# Patient Record
Sex: Female | Born: 1965 | Race: White | Hispanic: No | Smoking: Former smoker
Health system: Southern US, Community
[De-identification: ages and names within clinical notes are randomized; demographics above are authoritative.]

## PROBLEM LIST (undated history)

## (undated) DIAGNOSIS — Z789 Other specified health status: Secondary | ICD-10-CM

## (undated) DIAGNOSIS — F418 Other specified anxiety disorders: Secondary | ICD-10-CM

## (undated) DIAGNOSIS — Z7289 Other problems related to lifestyle: Secondary | ICD-10-CM

## (undated) DIAGNOSIS — I509 Heart failure, unspecified: Secondary | ICD-10-CM

## (undated) HISTORY — DX: Heart failure, unspecified: I50.9

## (undated) HISTORY — PX: CHOLECYSTECTOMY: SHX55

## (undated) HISTORY — PX: GASTRIC BYPASS: SHX52

---

## 1998-10-27 ENCOUNTER — Other Ambulatory Visit: Admission: RE | Admit: 1998-10-27 | Discharge: 1998-10-27 | Payer: Self-pay | Admitting: Obstetrics & Gynecology

## 1999-12-29 ENCOUNTER — Other Ambulatory Visit: Admission: RE | Admit: 1999-12-29 | Discharge: 1999-12-29 | Payer: Self-pay | Admitting: Obstetrics and Gynecology

## 2000-09-19 ENCOUNTER — Encounter (INDEPENDENT_AMBULATORY_CARE_PROVIDER_SITE_OTHER): Payer: Self-pay

## 2000-09-19 ENCOUNTER — Other Ambulatory Visit: Admission: RE | Admit: 2000-09-19 | Discharge: 2000-09-19 | Payer: Self-pay | Admitting: Obstetrics and Gynecology

## 2000-09-27 ENCOUNTER — Encounter (INDEPENDENT_AMBULATORY_CARE_PROVIDER_SITE_OTHER): Payer: Self-pay | Admitting: Specialist

## 2000-09-27 ENCOUNTER — Ambulatory Visit (HOSPITAL_COMMUNITY): Admission: RE | Admit: 2000-09-27 | Discharge: 2000-09-27 | Payer: Self-pay | Admitting: Obstetrics and Gynecology

## 2001-02-07 ENCOUNTER — Other Ambulatory Visit: Admission: RE | Admit: 2001-02-07 | Discharge: 2001-02-07 | Payer: Self-pay | Admitting: Obstetrics and Gynecology

## 2002-02-19 ENCOUNTER — Other Ambulatory Visit: Admission: RE | Admit: 2002-02-19 | Discharge: 2002-02-19 | Payer: Self-pay | Admitting: Obstetrics and Gynecology

## 2003-03-03 ENCOUNTER — Other Ambulatory Visit: Admission: RE | Admit: 2003-03-03 | Discharge: 2003-03-03 | Payer: Self-pay | Admitting: Obstetrics and Gynecology

## 2003-04-21 ENCOUNTER — Encounter: Admission: RE | Admit: 2003-04-21 | Discharge: 2003-07-20 | Payer: Self-pay | Admitting: *Deleted

## 2003-04-23 ENCOUNTER — Encounter: Admission: RE | Admit: 2003-04-23 | Discharge: 2003-04-23 | Payer: Self-pay | Admitting: *Deleted

## 2003-05-04 ENCOUNTER — Ambulatory Visit (HOSPITAL_COMMUNITY): Admission: RE | Admit: 2003-05-04 | Discharge: 2003-05-04 | Payer: Self-pay | Admitting: *Deleted

## 2003-05-04 ENCOUNTER — Encounter (INDEPENDENT_AMBULATORY_CARE_PROVIDER_SITE_OTHER): Payer: Self-pay | Admitting: Cardiology

## 2003-05-07 ENCOUNTER — Ambulatory Visit (HOSPITAL_BASED_OUTPATIENT_CLINIC_OR_DEPARTMENT_OTHER): Admission: RE | Admit: 2003-05-07 | Discharge: 2003-05-07 | Payer: Self-pay | Admitting: *Deleted

## 2003-07-05 ENCOUNTER — Inpatient Hospital Stay (HOSPITAL_COMMUNITY): Admission: RE | Admit: 2003-07-05 | Discharge: 2003-07-08 | Payer: Self-pay | Admitting: Surgery

## 2003-07-09 ENCOUNTER — Inpatient Hospital Stay (HOSPITAL_COMMUNITY): Admission: EM | Admit: 2003-07-09 | Discharge: 2003-07-11 | Payer: Self-pay

## 2004-04-04 ENCOUNTER — Other Ambulatory Visit: Admission: RE | Admit: 2004-04-04 | Discharge: 2004-04-04 | Payer: Self-pay | Admitting: Obstetrics and Gynecology

## 2004-06-02 ENCOUNTER — Ambulatory Visit (HOSPITAL_COMMUNITY): Admission: RE | Admit: 2004-06-02 | Discharge: 2004-06-02 | Payer: Self-pay | Admitting: Surgery

## 2004-06-19 ENCOUNTER — Observation Stay (HOSPITAL_COMMUNITY): Admission: RE | Admit: 2004-06-19 | Discharge: 2004-06-20 | Payer: Self-pay | Admitting: Surgery

## 2004-06-19 ENCOUNTER — Encounter (INDEPENDENT_AMBULATORY_CARE_PROVIDER_SITE_OTHER): Payer: Self-pay | Admitting: Specialist

## 2005-05-30 ENCOUNTER — Other Ambulatory Visit: Admission: RE | Admit: 2005-05-30 | Discharge: 2005-05-30 | Payer: Self-pay | Admitting: Obstetrics and Gynecology

## 2005-06-08 ENCOUNTER — Encounter: Admission: RE | Admit: 2005-06-08 | Discharge: 2005-06-08 | Payer: Self-pay | Admitting: Orthopaedic Surgery

## 2005-06-20 ENCOUNTER — Ambulatory Visit (HOSPITAL_COMMUNITY): Admission: RE | Admit: 2005-06-20 | Discharge: 2005-06-20 | Payer: Self-pay | Admitting: *Deleted

## 2006-03-01 ENCOUNTER — Ambulatory Visit (HOSPITAL_COMMUNITY): Admission: RE | Admit: 2006-03-01 | Discharge: 2006-03-01 | Payer: Self-pay | Admitting: Obstetrics and Gynecology

## 2007-03-15 ENCOUNTER — Emergency Department (HOSPITAL_COMMUNITY): Admission: EM | Admit: 2007-03-15 | Discharge: 2007-03-15 | Payer: Self-pay | Admitting: Emergency Medicine

## 2010-09-10 ENCOUNTER — Encounter: Payer: Self-pay | Admitting: Orthopaedic Surgery

## 2011-01-05 NOTE — Discharge Summary (Signed)
NAME:  Brianna Erickson, Brianna Erickson                        ACCOUNT NO.:  [id number]   MEDICAL RECORD NO.:  [id number]                   PATIENT TYPE:  INP   LOCATION:  0454                                 FACILITY:  [facility]   PHYSICIAN:  Sandria Bales. Ezzard Standing, M.D.               DATE OF BIRTH:  [birth year]   DATE OF ADMISSION:  07/05/2003  DATE OF DISCHARGE:  07/08/2003                                 DISCHARGE SUMMARY   DISCHARGE DIAGNOSES:  1. Morbid obesity with a BMI of approximately 47.  2. Depression.  3. Reflux esophagitis.  4. Degenerative arthritic complaints.   OPERATION PERFORMED:  The patient had a laparoscopic Roux-en-Y  gastrojejunostomy and an upper esophagogastroscopy on July 05, 2003.   HISTORY OF PRESENT ILLNESS:  [name] is a 45 year old white female patient  of C. Duane Lope, M.D. who has been morbidly obese most of her adult life.  Her current height is 5 feet 9 inches.  Her weight is 329 pounds for a BMI  of 47.  She has been through our bariatric program with preoperative  counseling including nutritional consult, psychiatric consult, and a review  of laboratory data.  She now comes for attempted laparoscopic Roux-en-Y  gastrojejunostomy.  Her comorbid problems include depression, reflux  esophagitis, degenerative arthritic complaints such as heel spurs.   She presents to the hospital after completing both a two week diet and bowel  prep.   She underwent a laparoscopic Roux-en-Y gastrojejunostomy and Sharlet Salina T.  Hoxworth, M.D. did an upper esophagogastroscopy at the same time.   Postoperatively she did well.  On her first postoperative day her white  blood count was 10,200.  Her output of her JP was minimal.  Dopplers of her  lower extremities were negative and upper GI swallow showed no evidence of  leak and she was started on a diet.  On her second postoperative day she had  some question about blood pressure recordings during the evening of the  16th, morning of the  17th.  I think this was all pretty much straightened by  morning rounds.  She had also had some headaches questionably secondary to  some of her medicines.  Her diet was slowly advanced.  By the third  postoperative day she was afebrile.  Her abdomen was soft and she was ready  for discharge.   She was given Roxicet as a pain medicine to go home with.  She was using  Ensure q.i.d.  She would change the dressing as needed and see our bariatric  coordinator nurse back on July 12, 2003.   CONDITION ON DISCHARGE:  Good.                                               Sandria Bales. Ezzard Standing, M.D.  DHN/MEDQ  D:  07/20/2003  T:  07/20/2003  Job:  [id number]   cc:   C. Duane Lope, M.D.  [address]  [place]  [place] [address]  Fax: [phone]

## 2011-01-05 NOTE — Op Note (Signed)
NAMEPANG, ROBERS              ACCOUNT NO.:  [id number]   MEDICAL RECORD NO.:  [id number]          PATIENT TYPE:  AMB   LOCATION:  DAY                          FACILITY:  [facility]   PHYSICIAN:  Sandria Bales. Ezzard Standing, M.D.  DATE OF BIRTH:  [birth year]   DATE OF PROCEDURE:  06/19/2004  DATE OF DISCHARGE:                                 OPERATIVE REPORT   PREOPERATIVE DIAGNOSIS:  Symptomatic gall stones, vague epigastic symptoms,  rule out problems with RYGB, 4 mm nodule inside right upper lip   POST OPERATIVE DIAGNOSIS:  Cholecystitis with cholelithiasis, minimal intra-  abdominal adhesions status post laparoscopic Roux-Y gastric bypass, a normal  esophagus, gastric pouch, and jejunal anastomosis, and 4 mm nodule inside  right upper lip.   PROCEDURE:  1.  Laparoscopic cholecystectomy with IOC.  2.  Laparoscopic exploration with enterolysis of at least two adhesive      bands.  3.  Esophagogastrojejunoscopy.  4.  Excision of lesion inside right upper lip.   SURGEON:  Dr. Ezzard Standing   FIRST ASSISTANT:  Dr. Carman Ching   ANESTHESIA:  General endotracheal.   ESTIMATED BLOOD LOSS:  Minimal.   INDICATIONS FOR PROCEDURE:  Brianna Erickson is a 45 year old white female, patient  of Dr. Miguel Aschoff, who is approximately 1 year status post laparoscopic Roux-  en-Y gastrojejunostomy.  She has lost approximately 100 pounds since  surgery.  The last weight that I had was 238 with a BMI of 35.  She has  developed epigastric pain which sometimes radiates through to her back, had  an ultrasound which proved she had gallstones.   I discussed with her at the same time doing a cholecystectomy probably do an  upper endoscopy to make sure her anastomosis is widely patent and there are  no other problems.  The indications and potential complications explained to  the patient.  Potential complications include but not limited to bleeding,  infection, bile duct injury, the possibility of open surgery.   OPERATIVE NOTE:  The patient presents to the operating room, underwent a  general anesthetic, given 1 g of Ancef at the initiation of the procedure.  She had PAS stockings in place.  Her abdomen was prepped with Betadine  solution and sterilely draped.  A infraumbilical incision was made with  sharp dissection carried down to the abdominal cavity.  A 10 mm 0-degree  laparoscope was inserted through a 12 mm Hasson trocar, and the Hasson  trocar was secured with a 0 Vicryl suture.  Abdominal exploration revealed  the right and left lobes of the liver were unremarkable.  There were some  adhesions on the undersurface of the left lobe of the liver where her  stomach was but really looked unremarkable.  Her bowel, which I could see,  she had a significant left omentum when I saw her a year ago, and I then  went first for the gallbladder.   Three additional trocars were placed, a 10 mm applied surgical in the  subxiphoid location, a 5 mm right subcostal, and a 5 mm lateral subcostal  incision.  The gallbladder was  grasped and rotated cephalad.  There were  noted to be quite a bit of adhesions up 3/4 of the gallbladder wall with the  duodenum stuck to this.   These were all taken down with sharp and blunt dissection.  The cystic  artery was doubly endoclipped and divided.  The cystic duct was identified,  a clip placed on the gallbladder side of the cystic duct.   An intraoperative cholangiogram was then obtained.  I used a cutoff taut  catheter inserted through a 14 gauge Jelco catheter, inserted into the side  of the cut cystic duct.   The cystic duct catheter was secured with an Endo Clip.  There was some very  minimal leakage around the clip, but it did show contrast going down the  common bile duct into the duodenum and back up towards the hepatic radicles.  There was no filling defect.  There was no mass.  Certainly, I would  consider it a suboptimal intraoperative cholangiogram;  however, again, there  was no filling defect, and the anatomy looked normal, and I thought I was  well away from the common bile duct from surgical procedure.   I then removed the taut catheter.  I put three clips on the cystic duct and  divided it.  I had already divided the cystic artery and then sharply and  bluntly dissected the gallbladder from the gallbladder bed.   The triangle of Calot was visualize.  There was no bleeding from this area.  The gallbladder was then sharply and bluntly dissected from gallbladder bed  and placed into EndoCatch bag.  After completing the gallbladder surgery, I  delivered the gallbladder through the umbilicus in the EndoCatch bag.   I then carried out an exploration of the abdominal cavity using some  Glassmans.  I was able to lift up the liver.  I was able to trace the  jejunal limb up to the stomach pouch and ran the entire length of the  jejunal limb down to the jejunojejunostomy.  There was no kink.  There were  no adhesions.  There was no evidence of any kind of hernia such as a  Peterson's hernia, but there was no evidence of any hernia internally that I  could identify and no cause for her symptoms.   The trocar was then removed, and there was no bleeding at trocar site.  The  umbilical trocar was closed with a 0 Vicryl suture.  The skin edge site  closed with a 5-0 Monocryl suture.   I then turned my attention to her mouth.  I first did an upper endoscopy by  passing Olympus endoscope down her esophagus into her gastric pouch and into  her jejunum.  The jejunal mucosal was totally normal without evidence of  ulceration or other problems.  The anastomosis was at 45 cm.  The  gastroesophageal junction was at 40 cm for a 5 cm pouch.  The pouch was not  all that big.  It really looked like it had fairly normal gastric mucosa  without ulceration, other redness, or bleeding.  The scope was then withdrawn into the distal esophagus.  There was no  mass, bleeding, or  irritation noted.   This was felt to be a normal postoperative esophagogastrojejunoscopy and  would recommend no further diagnostic tests at this time regarding her  epigastric symptoms.   Then I took out a small cyst or a little mass she had on her right upper  lip.  I prepped this with Betadine solution, made a little incision where I  excised this little nodule which was probably about 3-4 mm in diameter.  I  then placed two 3-0 chromic sutures in her upper lip in the inside mucosa  and then this was the end of the procedure.   The sponge and needle count were correct at the end of the case.  The  patient was transferred to the recovery room in good condition.  She was  going to try go home tonight, though it is already about 3:00, so I am not  sure whether she will be able to do this or not.     Pervis Hocking   DHN/MEDQ  D:  06/19/2004  T:  06/19/2004  Job:  [id number]   cc:   Miguel Aschoff, M.D.  [address]  [place]  [place] [address]  Fax: [phone]

## 2011-01-05 NOTE — Op Note (Signed)
[facility]  Patient:    Brianna Erickson, Brianna Erickson                     MRN: [id number] Proc. Date: 09/27/00 Adm. Date:  [id number] Attending:  Miguel Aschoff                           Operative Report  PREOPERATIVE DIAGNOSES:       1. Menometrorrhagia.                               2. Cervical polyp.  POSTOPERATIVE DIAGNOSES:      1. Menometrorrhagia.                               2. Cervical polyp.  PROCEDURE:                    1. Cervical dilatation.                               2. Hysteroscopy.                               3. Dilation and curettage.  SURGEON:                      Miguel Aschoff, M.D.  ANESTHESIA:                   General.  COMPLICATIONS:                None.  JUSTIFICATION:                The patient is a 45 year old white female on Loestrin oral contraceptives. The patient developed heavy vaginal bleeding following intercourse. On examination, she was found to have a large, hemorrhagic mass protruding through the cervix which was excised. 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 The patient had this done on January 30 but continued to have heavy bleeding and presents now to undergo hysteroscopy and D&C to insure that no further polyps exist within the confines of the uterus. Risks and benefits of this procedure have been discussed with the patient and informed consent has been obtained.  DESCRIPTION OF PROCEDURE:     The patient was taken to the operating room, placed in a supine position, and then general anesthesia was administered without difficulty. She was placed in the dorsal lithotomy position and prepped and draped in the usual sterile fashion. A speculum was placed in the vaginal vault and the anterior cervical lip was grasped with a tenaculum and then the endocervical canal was dilated using 0 Pratt dilators. On initial inspection, no polyps were noted coming through the cervix. At  this point, the hysteroscope was advanced under direct visualization. No endocervical polyps were noted. The scope was then advanced into endometrial cavity. No polyps were found within the cavity. The lining appeared atrophic. The tubal ostia were visible and appeared unremarkable. At this point, the hysteroscope was removed. The endocervical canal was curetted using the Kevorkian curet. This was sent as a separate specimen, then the endometrial cavity was curetted and a small amount of tissue was sent  off for histologic study. At this point, the procedure was completed. No other abnormalities were noted. There was excellent hemostasis and the patient was reversed from the anesthetic and taken to the recovery room in satisfactory condition. The estimated blood loss was about 20 cc. The patient tolerated the procedure well.  The plan is for the patient to be discharged home. Medications for home include Darvocet-N 100, one every six hours as needed for pain, Bloxin 300 mg, one twice a day for two days. She will be seen back in four weeks for followup examination and she is to call if there are any problems such as fever, pain, or heavy bleeding. DD:  09/27/00 TD:  09/28/00 Job: [id number]

## 2011-01-05 NOTE — Op Note (Signed)
NAME:  Brianna Erickson, Brianna Erickson                        ACCOUNT NO.:  [id number]   MEDICAL RECORD NO.:  [id number]                   PATIENT TYPE:  INP   LOCATION:  0001                                 FACILITY:  [facility]   PHYSICIAN:  Sandria Bales. Ezzard Standing, M.D.               DATE OF BIRTH:  [birth year]   DATE OF PROCEDURE:  07/05/2003  DATE OF DISCHARGE:                                 OPERATIVE REPORT   CCS [phone].   PREOPERATIVE DIAGNOSIS:  Morbid obesity with body mass index of 47.   POSTOPERATIVE DIAGNOSIS:  Morbidly obese with body mass index of 47.   PROCEDURES:  1. Laparoscopic Roux-en-Y gastrojejunostomy (antecolic, antegastric).  2. Esophagogastroscopy.   SURGEON:  Sandria Bales. Ezzard Standing, M.D.   ASSISTANT:  Sharlet Salina T. Hoxworth, M.D.  Dr. Johna Sheriff did the  esophagogastroscopy and will dictate that portion of the operation.   ANESTHESIA:  General endotracheal.   ESTIMATED BLOOD LOSS:  Minimal.   INDICATION FOR PROCEDURE:  Ms. Colunga is a 45 year old white female who has  been morbidly obese her entire adult life.  She has tried multiple diets  without success and now comes for an attempted laparoscopic Roux-en-Y  gastrojejunostomy.  She has been throughout preoperative teaching and  counseling, and we went through the potential complications with the  patient, including bleeding, infection, bile leak, bowel leak, deep venous  thrombosis, long-term nutritional consequences.   She presents to the operating room.  She had subcu Lovenox and IV Ancef  preoperatively.  She had PAS stockings in place, NG tube in place.  The  abdomen was prepped with Betadine solution and sterilely draped.   Using an Optiview, I accessed the abdominal cavity from the left upper  quadrant.  I insufflated the abdomen.   There were additional trocars.  There was a right subcostal 12 mm, a right  paramedian 12 mm, a left paramedian 12 mm, the original trocar was a 12 mm  that went into the left upper quadrant,  and then a left lateral 5 mm and  then a right paraumbilical 10 mm trocar.   Abdominal exploration revealed the stomach unremarkable.  The patient had a  fairly good-sized left lobe of the liver.  The right lobe was unremarkable.  The patient had actually a small amount of omentum.  Much of her fat was  down in the hips and her thighs.   I first went to the ligament of Treitz, which I identified, went down 40 cm,  and divided the jejunum with a white load of the stapler.   I then took the mesentery down to the base, freeing this up.  I then tagged  the limb going to the stomach with a Penrose drain, counted down 100 cm, and  did a jejunojejunostomy.  The jejunojejunostomy was done, first a holding  suture was placed, then a single firing of the white Ethicon GIA 45 mm  stapler.  I then closed the enterotomy with two running 2-0 Vicryl sutures.  I actually had to put one or two additional buttress sutures to close the  wound.   I then closed the mesentery with a running 2-0 silk suture with a Laparo-Tie  on both ends.   I placed Tisseel over the jejunojejunostomy and marked the jejunojejunostomy  with three clips.   I did get a hematoma right at the anastomosis with one of the stitches, but  this had stopped by the time I put the Tisseel on.  I actually rechecked at  the end of the operation and it had expanded no further.   I then turned my attention to the stomach.  After dividing the stomach, I  went down 5 cm along the lesser curve, got into the lesser sac along the  lesser curve of the stomach.  I did seven firings of the blue Endo-GIA  stapler, first transversely, then tried to walk up the stomach wall.   I had the stomach completely divided and separated.  I did place Tisseel up  along the greater curvature and the dissected greater curvature, and I used  an Ewald tube to size the stomach and make sure the esophagus was not  narrowed.   After this was complete, I then  brought up the gastric limb of the jejunum  up antecolic, antegastric, and then made an end of the stomach pouch to the  side of the jejunal anastomosis.   I sewed a posterior running 2-0 Vicryl suture.  I then made two  enterotomies, and one was with the Ewald tube pressing against the stomach,  and entered the stomach.  I then fired the stapler up trying to get about 3  cm of stapler into the stomach pouch, and then closed the enterotomy with  two running 2-0 Vicryl sutures.  Again, I had to buttress the corner along  the lesser curve with a single stitch.  Then I placed an anterior stitch of  2-0 Vicryl over the wound for a double-layer closure.   At this point Dr. Johna Sheriff went to the head.  He passed an endoscope down  into the stomach pouch.  He insufflated the stomach while I had the small  bowel clamped.  I flooded the upper abdomen and saw no evidence of air leak  or bubbling.  He then inspected the anastomosis, which was patent.  He took  a picture of this.  This is in the chart.  There was no bleeding from the  pouch site.  The anastomosis was at approximately 45 cm.  Her GE junction  was about 39-40 cm, so she had a pouch of 5-6 cm in length.   He then desufflated the stomach.  He will dictate the esophagogastroscopy.  He then deflated the stomach.  I placed Tisseel over the anterior wall of  the stomach, a total of about 5 mL of Tisseel was used during the procedure.  I then removed the Nathanson retractor and placed a Jackson-Pratt drain  under the left lobe of the liver.   The patient tolerated the procedure well.  The trocars were then removed  under direct visualization.  Sponge and needle count were correct at the end  of the case.  I stapled the skin, sewed the 19 mm Blake drain that I brought  out through the left upper quadrant 5 mm drain site.  Again, I placed this up at the esophagogastric junction and sewed this in place  with a 2-0 nylon  suture.  I closed the  skin with a skin staple with Steri-Strips on top.   The patient tolerated the procedure well, was transported to the recovery  room in good condition.  Sponge and needle count were correct at the end of  the case.                                               Sandria Bales. Ezzard Standing, M.D.    DHN/MEDQ  D:  07/05/2003  T:  07/05/2003  Job:  [id number]   cc:   Dr. Tenny Craw

## 2011-06-04 LAB — BASIC METABOLIC PANEL
BUN: 16
CO2: 28
Calcium: 8.7
Chloride: 100
Creatinine, Ser: 0.71
GFR calc Af Amer: >60
GFR calc non Af Amer: >60
Glucose, Bld: 115 — ABNORMAL HIGH
Potassium: 3.5
Sodium: 136

## 2011-06-04 LAB — DIFFERENTIAL
Basophils Absolute: 0
Basophils Relative: 0
Eosinophils Absolute: 0
Eosinophils Relative: 0
Lymphocytes Relative: 12
Lymphs Abs: 1.3
Monocytes Absolute: 0.8 — ABNORMAL HIGH
Monocytes Relative: 8
Neutro Abs: 8.8 — ABNORMAL HIGH
Neutrophils Relative %: 80 — ABNORMAL HIGH

## 2011-06-04 LAB — CBC
HCT: 32.4 — ABNORMAL LOW
Hemoglobin: 11.2 — ABNORMAL LOW
MCHC: 34.6
MCV: 92.8
Platelets: 200
RBC: 3.49 — ABNORMAL LOW
RDW: 14.4 — ABNORMAL HIGH
WBC: 11 — ABNORMAL HIGH

## 2011-08-24 ENCOUNTER — Other Ambulatory Visit: Payer: Self-pay | Admitting: Obstetrics and Gynecology

## 2013-02-05 ENCOUNTER — Other Ambulatory Visit: Payer: Self-pay | Admitting: Orthopaedic Surgery

## 2013-02-05 DIAGNOSIS — M2170 Unequal limb length (acquired), unspecified site: Secondary | ICD-10-CM

## 2016-11-05 ENCOUNTER — Ambulatory Visit (INDEPENDENT_AMBULATORY_CARE_PROVIDER_SITE_OTHER): Payer: Self-pay | Admitting: Orthopaedic Surgery

## 2017-10-29 ENCOUNTER — Ambulatory Visit (INDEPENDENT_AMBULATORY_CARE_PROVIDER_SITE_OTHER): Payer: 59 | Admitting: Orthopaedic Surgery

## 2017-10-29 ENCOUNTER — Ambulatory Visit (INDEPENDENT_AMBULATORY_CARE_PROVIDER_SITE_OTHER): Payer: 59

## 2017-10-29 ENCOUNTER — Encounter (INDEPENDENT_AMBULATORY_CARE_PROVIDER_SITE_OTHER): Payer: Self-pay | Admitting: Orthopaedic Surgery

## 2017-10-29 DIAGNOSIS — G8929 Other chronic pain: Secondary | ICD-10-CM

## 2017-10-29 DIAGNOSIS — M76822 Posterior tibial tendinitis, left leg: Secondary | ICD-10-CM | POA: Diagnosis not present

## 2017-10-29 DIAGNOSIS — M545 Low back pain: Secondary | ICD-10-CM

## 2017-10-29 MED ORDER — CYCLOBENZAPRINE HCL 10 MG PO TABS: 10.0000 mg | ORAL_TABLET | Freq: Every day | ORAL | 1 refills | Status: DC

## 2017-10-29 MED ORDER — METHYLPREDNISOLONE 4 MG PO TABS: ORAL_TABLET | ORAL | 0 refills | Status: DC

## 2017-10-29 NOTE — Progress Notes (Addendum)
Office Visit Note   Patient: Brianna Erickson           Date of Birth: 03/18/1966           MRN: [id number] Visit Date: 10/29/2017              Requested by: No referring provider defined for this encounter. PCP: Patient, No Pcp Per   Assessment & Plan: Visit Diagnoses:  1. Posterior tibial tendon dysfunction, left   2. Chronic bilateral low back pain, with sciatica presence unspecified     Plan: Discussed with her at length the need for weight loss.  Suggested she get involved with the local pool and water aerobics.  Discussed with her at length shoewear.  We will send her for a custom insert for the left foot with a medial hindfoot wedge.  Send her to physical therapy for the posterior tibial tendon dysfunction and for her low back this is to include strengthening home exercise program and modalities.  She is placed on a Medrol Dosepak.  She given Flexeril to take just at night for back spasm/muscle spasm.  She will follow-up with us in 1 month check progress lack of.  Questions encouraged and answered by Dr. Magnus IvanBlackman and myself.  Follow-Up Instructions: Return in about 4 weeks (around 11/26/2017).   Orders:  Orders Placed This Encounter  Procedures  . XR Foot Complete Left  . XR Lumbar Spine 2-3 Views   Meds ordered this encounter  Medications  . cyclobenzaprine (FLEXERIL) 10 MG tablet    Sig: Take 1 tablet (10 mg total) by mouth at bedtime.    Dispense:  25 tablet    Refill:  1  . methylPREDNISolone (MEDROL) 4 MG tablet    Sig: Take as directed    Dispense:  21 tablet    Refill:  0      Procedures: No procedures performed   Clinical Data: No additional findings.   Subjective: Chief Complaint  Patient presents with  . Lower Back - Pain  . Left Foot - Pain    HPI Mrs. Brianna Erickson is a 52 year old female who has not been seen in some time by Dr. Magnus IvanBlackman.  Comes in today for low back pain and left foot pain.  She states that she started having left foot pain 1 year  ago and her arch then had severe pain and had to go to the ER was placed in a Cam walker boot for weeks and then sent to physical therapy.  Despite this she continues to have pain in the left foot.  She notes that her foot is becoming more more of flat.  She is having decreased mobility due to left foot pain.  She denies any injury.  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  She is nondiabetic.  The numbness in the foot is been going on for at least last year.  Is also having low back pain without known injury.  Pain does awaken her.  Radiates down the left hip and her left hip goes numb at times.  She states her back "goes out" once a month and she has severe pain from days where she has extreme difficulty getting around.  She has had no bowel bladder function.  She rates her back pain being 5 out of 10 constantly and 10 out of 10 at worst.  She is taking ibuprofen which helps some.  She is also grieving from  loss of her parents is working with her primary care physician on her depression.  She is nonsuicidal at this point.  She denies any recent fevers chills chest pain or shortness of breath.  Review of Systems  Constitutional: Positive for activity change, fatigue and unexpected weight change.  Respiratory: Negative for shortness of breath.   Cardiovascular: Negative for chest pain and leg swelling.  Gastrointestinal:       Denies any gastritis or reflux  Musculoskeletal: Positive for back pain.  Neurological: Positive for numbness.  See HPI    Objective: Vital Signs: 5 feet 11 350 pounds calculated BMI of 35  Physical Exam  Constitutional: She is oriented to person, place, and time. She appears well-developed and well-nourished. No distress.  Cardiovascular: Intact distal pulses.  Pulmonary/Chest: Effort normal.  Neurological: She is alert and oriented to person, place, and time.  Skin: She is not diaphoretic.  Psychiatric: Her speech is  normal. Judgment and thought content normal. Cognition and memory are normal. She exhibits a depressed mood.    Ortho Exam Bilateral feet no impending ulcers.  She has a callus at the tip of the left great toe.  Medial border of the plantar aspect of the left foot is callused.  Left pes planovalgus deformity.  Too many toes sign.  Unable to do single heel raise on the left able to perform this on the right with some difficulty.  She has 5 out of 5 strength with eversion against resistance bilateral feet.  5 out of 5 strength dorsiflexion plantarflexion against resistance bilateral ankles.  Left foot 4 out of 5 strength with inversion against resistance right foot 5 out of 5 strength with inversion against resistance.  She has tenderness over the left peroneal tendon tenderness over the left sinus Tarsi region.  Remainder bilateral feet otherwise nontender.  Achilles are intact bilaterally and nontender.  Calves are supple nontender.  Subjective decreased sensation throughout the left foot to light touch. Lower extremity strength is 5 out of 5 strength except for left foot with inversion against resistance is 4 out of 5.  Negative straight leg raise bilaterally.  Deep tendon reflexes are 1+ at knees and ankles and equal and symmetric.  She has good range of motion of bilateral hips.  Slight tenderness over the greater trochanteric region of both hips. Specialty Comments:  No specialty comments available.  Imaging: Xr Foot Complete Left  Result Date: 10/29/2017 Left foot: 3 views: No acute fractures.  Pes planus deformity.  Talar heads well located.  Dorsal spur off the navicular bone.  Os trigonum present.  Plantars heel spur present.  No significant arthritic changes.  Xr Lumbar Spine 2-3 Views  Result Date: 10/29/2017 Lumbar spine AP lateral views: Shows lower lumbar degenerative disc disease particularly at L4-5 and L5-S1.  Facet arthritic changes lower lumbar spine.  Slight loss of lordotic  curvature.  No acute findings.    PMFS History: There are no active problems to display for this patient.  History reviewed. No pertinent past medical history.  History reviewed. No pertinent family history.  History reviewed. No pertinent surgical history. Social History   Occupational History  . Not on file  Tobacco Use  . Smoking status: Not on file  Substance and Sexual Activity  . Alcohol use: Not on file  . Drug use: Not on file  . Sexual activity: Not on file

## 2017-12-02 ENCOUNTER — Ambulatory Visit (INDEPENDENT_AMBULATORY_CARE_PROVIDER_SITE_OTHER): Payer: 59 | Admitting: Orthopaedic Surgery

## 2017-12-26 ENCOUNTER — Other Ambulatory Visit (INDEPENDENT_AMBULATORY_CARE_PROVIDER_SITE_OTHER): Payer: Self-pay | Admitting: Physician Assistant

## 2017-12-26 NOTE — Telephone Encounter (Signed)
Rx request 

## 2019-08-02 DIAGNOSIS — F1020 Alcohol dependence, uncomplicated: Secondary | ICD-10-CM | POA: Insufficient documentation

## 2019-08-03 DIAGNOSIS — K219 Gastro-esophageal reflux disease without esophagitis: Secondary | ICD-10-CM | POA: Insufficient documentation

## 2019-08-03 DIAGNOSIS — F332 Major depressive disorder, recurrent severe without psychotic features: Secondary | ICD-10-CM | POA: Insufficient documentation

## 2019-08-03 DIAGNOSIS — R8290 Unspecified abnormal findings in urine: Secondary | ICD-10-CM | POA: Insufficient documentation

## 2019-08-03 DIAGNOSIS — F1210 Cannabis abuse, uncomplicated: Secondary | ICD-10-CM | POA: Insufficient documentation

## 2019-08-03 DIAGNOSIS — R739 Hyperglycemia, unspecified: Secondary | ICD-10-CM | POA: Insufficient documentation

## 2019-08-03 DIAGNOSIS — F1310 Sedative, hypnotic or anxiolytic abuse, uncomplicated: Secondary | ICD-10-CM | POA: Insufficient documentation

## 2019-08-03 DIAGNOSIS — R45851 Suicidal ideations: Secondary | ICD-10-CM | POA: Insufficient documentation

## 2019-08-03 DIAGNOSIS — F411 Generalized anxiety disorder: Secondary | ICD-10-CM | POA: Insufficient documentation

## 2020-06-20 ENCOUNTER — Ambulatory Visit: Payer: Self-pay | Admitting: Family Medicine

## 2020-07-20 ENCOUNTER — Encounter (HOSPITAL_BASED_OUTPATIENT_CLINIC_OR_DEPARTMENT_OTHER): Payer: Self-pay

## 2020-07-20 ENCOUNTER — Emergency Department (HOSPITAL_BASED_OUTPATIENT_CLINIC_OR_DEPARTMENT_OTHER): Payer: 59

## 2020-07-20 ENCOUNTER — Inpatient Hospital Stay (HOSPITAL_BASED_OUTPATIENT_CLINIC_OR_DEPARTMENT_OTHER)
Admission: EM | Admit: 2020-07-20 | Discharge: 2020-07-26 | DRG: 308 | Disposition: A | Discharge status: Home Health | Payer: 59 | Attending: Internal Medicine | Admitting: Internal Medicine

## 2020-07-20 ENCOUNTER — Other Ambulatory Visit: Payer: Self-pay

## 2020-07-20 DIAGNOSIS — Z8249 Family history of ischemic heart disease and other diseases of the circulatory system: Secondary | ICD-10-CM

## 2020-07-20 DIAGNOSIS — F419 Anxiety disorder, unspecified: Secondary | ICD-10-CM | POA: Diagnosis present

## 2020-07-20 DIAGNOSIS — Z79899 Other long term (current) drug therapy: Secondary | ICD-10-CM

## 2020-07-20 DIAGNOSIS — I483 Typical atrial flutter: Secondary | ICD-10-CM | POA: Diagnosis present

## 2020-07-20 DIAGNOSIS — I4891 Unspecified atrial fibrillation: Secondary | ICD-10-CM | POA: Diagnosis present

## 2020-07-20 DIAGNOSIS — Z9884 Bariatric surgery status: Secondary | ICD-10-CM | POA: Diagnosis not present

## 2020-07-20 DIAGNOSIS — K0889 Other specified disorders of teeth and supporting structures: Secondary | ICD-10-CM | POA: Diagnosis present

## 2020-07-20 DIAGNOSIS — Z20822 Contact with and (suspected) exposure to covid-19: Secondary | ICD-10-CM | POA: Diagnosis present

## 2020-07-20 DIAGNOSIS — M79606 Pain in leg, unspecified: Secondary | ICD-10-CM | POA: Diagnosis not present

## 2020-07-20 DIAGNOSIS — R0602 Shortness of breath: Secondary | ICD-10-CM

## 2020-07-20 DIAGNOSIS — F1010 Alcohol abuse, uncomplicated: Secondary | ICD-10-CM | POA: Diagnosis present

## 2020-07-20 DIAGNOSIS — F1290 Cannabis use, unspecified, uncomplicated: Secondary | ICD-10-CM | POA: Diagnosis present

## 2020-07-20 DIAGNOSIS — Z23 Encounter for immunization: Secondary | ICD-10-CM

## 2020-07-20 DIAGNOSIS — F17210 Nicotine dependence, cigarettes, uncomplicated: Secondary | ICD-10-CM | POA: Diagnosis present

## 2020-07-20 DIAGNOSIS — I5033 Acute on chronic diastolic (congestive) heart failure: Secondary | ICD-10-CM | POA: Diagnosis present

## 2020-07-20 DIAGNOSIS — Z0189 Encounter for other specified special examinations: Secondary | ICD-10-CM

## 2020-07-20 DIAGNOSIS — E876 Hypokalemia: Secondary | ICD-10-CM | POA: Diagnosis present

## 2020-07-20 DIAGNOSIS — F32A Depression, unspecified: Secondary | ICD-10-CM | POA: Diagnosis present

## 2020-07-20 DIAGNOSIS — Z6841 Body Mass Index (BMI) 40.0 and over, adult: Secondary | ICD-10-CM | POA: Diagnosis not present

## 2020-07-20 DIAGNOSIS — Z789 Other specified health status: Secondary | ICD-10-CM

## 2020-07-20 DIAGNOSIS — K219 Gastro-esophageal reflux disease without esophagitis: Secondary | ICD-10-CM | POA: Diagnosis present

## 2020-07-20 DIAGNOSIS — Z7289 Other problems related to lifestyle: Secondary | ICD-10-CM

## 2020-07-20 DIAGNOSIS — I4892 Unspecified atrial flutter: Secondary | ICD-10-CM | POA: Diagnosis present

## 2020-07-20 DIAGNOSIS — R0902 Hypoxemia: Secondary | ICD-10-CM | POA: Diagnosis present

## 2020-07-20 DIAGNOSIS — Z9049 Acquired absence of other specified parts of digestive tract: Secondary | ICD-10-CM

## 2020-07-20 DIAGNOSIS — Z66 Do not resuscitate: Secondary | ICD-10-CM | POA: Diagnosis present

## 2020-07-20 DIAGNOSIS — F418 Other specified anxiety disorders: Secondary | ICD-10-CM | POA: Diagnosis present

## 2020-07-20 DIAGNOSIS — M7989 Other specified soft tissue disorders: Secondary | ICD-10-CM

## 2020-07-20 HISTORY — DX: Other problems related to lifestyle: Z72.89

## 2020-07-20 HISTORY — DX: Other specified health status: Z78.9

## 2020-07-20 HISTORY — DX: Other specified anxiety disorders: F41.8

## 2020-07-20 LAB — HEPATIC FUNCTION PANEL
ALT: 17 U/L (ref 0–44)
AST: 23 U/L (ref 15–41)
Albumin: 3.2 g/dL — ABNORMAL LOW (ref 3.5–5.0)
Alkaline Phosphatase: 102 U/L (ref 38–126)
Bilirubin, Direct: 0.3 mg/dL — ABNORMAL HIGH (ref 0.0–0.2)
Indirect Bilirubin: 0.3 mg/dL (ref 0.3–0.9)
Total Bilirubin: 0.6 mg/dL (ref 0.3–1.2)
Total Protein: 7.6 g/dL (ref 6.5–8.1)

## 2020-07-20 LAB — BASIC METABOLIC PANEL
Anion gap: 9 (ref 5–15)
BUN: <5 mg/dL — ABNORMAL LOW (ref 6–20)
CO2: 30 mmol/L (ref 22–32)
Calcium: 8.6 mg/dL — ABNORMAL LOW (ref 8.9–10.3)
Chloride: 95 mmol/L — ABNORMAL LOW (ref 98–111)
Creatinine, Ser: 0.71 mg/dL (ref 0.44–1.00)
GFR, Estimated: >60 mL/min (ref >60)
Glucose, Bld: 137 mg/dL — ABNORMAL HIGH (ref 70–99)
Potassium: 3.7 mmol/L (ref 3.5–5.1)
Sodium: 134 mmol/L — ABNORMAL LOW (ref 135–145)

## 2020-07-20 LAB — APTT: aPTT: 26 seconds (ref 24–36)

## 2020-07-20 LAB — RESP PANEL BY RT-PCR (FLU A&B, COVID) ARPGX2
Influenza A by PCR: NEGATIVE
Influenza B by PCR: NEGATIVE
SARS Coronavirus 2 by RT PCR: NEGATIVE

## 2020-07-20 LAB — CBC
HCT: 34 % — ABNORMAL LOW (ref 36.0–46.0)
Hemoglobin: 10.6 g/dL — ABNORMAL LOW (ref 12.0–15.0)
MCH: 27.6 pg (ref 26.0–34.0)
MCHC: 31.2 g/dL (ref 30.0–36.0)
MCV: 88.5 fL (ref 80.0–100.0)
Platelets: 225 10*3/uL (ref 150–400)
RBC: 3.84 MIL/uL — ABNORMAL LOW (ref 3.87–5.11)
RDW: 17.2 % — ABNORMAL HIGH (ref 11.5–15.5)
WBC: 7.3 10*3/uL (ref 4.0–10.5)
nRBC: 0 % (ref 0.0–0.2)

## 2020-07-20 LAB — TROPONIN I (HIGH SENSITIVITY)
Troponin I (High Sensitivity): 6 ng/L (ref <18)
Troponin I (High Sensitivity): 6 ng/L (ref <18)

## 2020-07-20 LAB — LACTIC ACID, PLASMA: Lactic Acid, Venous: 2.5 mmol/L (ref 0.5–1.9)

## 2020-07-20 LAB — PREGNANCY, URINE: Preg Test, Ur: NEGATIVE

## 2020-07-20 LAB — MAGNESIUM: Magnesium: 1.8 mg/dL (ref 1.7–2.4)

## 2020-07-20 LAB — PROTIME-INR
INR: 1.2 (ref 0.8–1.2)
Prothrombin Time: 14.7 seconds (ref 11.4–15.2)

## 2020-07-20 LAB — BRAIN NATRIURETIC PEPTIDE: B Natriuretic Peptide: 88.7 pg/mL (ref 0.0–100.0)

## 2020-07-20 MED ORDER — DILTIAZEM HCL 25 MG/5ML IV SOLN
20.0000 mg | Freq: Once | INTRAVENOUS | Status: AC
  Administered 2020-07-20: 20 mg via INTRAVENOUS
  Filled 2020-07-20: qty 5

## 2020-07-20 MED ORDER — ACETAMINOPHEN 500 MG PO TABS
1000.0000 mg | ORAL_TABLET | Freq: Four times a day (QID) | ORAL | Status: DC | PRN
  Administered 2020-07-20: 1000 mg via ORAL
  Filled 2020-07-20: qty 2

## 2020-07-20 MED ORDER — MAGNESIUM SULFATE 2 GM/50ML IV SOLN
2.0000 g | Freq: Once | INTRAVENOUS | Status: AC
  Administered 2020-07-20: 2 g via INTRAVENOUS
  Filled 2020-07-20: qty 50

## 2020-07-20 MED ORDER — FUROSEMIDE 10 MG/ML IJ SOLN
40.0000 mg | Freq: Two times a day (BID) | INTRAMUSCULAR | Status: DC
  Administered 2020-07-20 – 2020-07-21 (×2): 40 mg via INTRAVENOUS
  Filled 2020-07-20 (×2): qty 4

## 2020-07-20 MED ORDER — ALPRAZOLAM 0.5 MG PO TABS
0.5000 mg | ORAL_TABLET | Freq: Once | ORAL | Status: AC
  Administered 2020-07-20: 0.5 mg via ORAL
  Filled 2020-07-20: qty 1

## 2020-07-20 MED ORDER — ALPRAZOLAM 0.5 MG PO TABS
0.5000 mg | ORAL_TABLET | Freq: Three times a day (TID) | ORAL | Status: DC | PRN
  Administered 2020-07-20 – 2020-07-26 (×16): 0.5 mg via ORAL
  Filled 2020-07-20 (×16): qty 1

## 2020-07-20 MED ORDER — ACETAMINOPHEN 325 MG PO TABS
650.0000 mg | ORAL_TABLET | Freq: Once | ORAL | Status: AC
  Administered 2020-07-20: 650 mg via ORAL
  Filled 2020-07-20: qty 2

## 2020-07-20 MED ORDER — DILTIAZEM HCL-DEXTROSE 125-5 MG/125ML-% IV SOLN (PREMIX)
5.0000 mg/h | INTRAVENOUS | Status: DC
  Administered 2020-07-20: 5 mg/h via INTRAVENOUS
  Filled 2020-07-20: qty 125

## 2020-07-20 MED ORDER — DILTIAZEM HCL-DEXTROSE 125-5 MG/125ML-% IV SOLN (PREMIX)
5.0000 mg/h | INTRAVENOUS | Status: DC
  Administered 2020-07-21: 15 mg/h via INTRAVENOUS
  Filled 2020-07-20: qty 125

## 2020-07-20 MED ORDER — ENOXAPARIN SODIUM 100 MG/ML ~~LOC~~ SOLN
90.0000 mg | SUBCUTANEOUS | Status: DC
  Administered 2020-07-20 – 2020-07-23 (×4): 90 mg via SUBCUTANEOUS
  Filled 2020-07-20 (×4): qty 1

## 2020-07-20 MED ORDER — ONDANSETRON HCL 4 MG/2ML IJ SOLN
4.0000 mg | Freq: Four times a day (QID) | INTRAMUSCULAR | Status: DC | PRN
  Administered 2020-07-20 – 2020-07-21 (×2): 4 mg via INTRAVENOUS
  Filled 2020-07-20 (×2): qty 2

## 2020-07-20 MED ORDER — ACETAMINOPHEN 325 MG PO TABS: 650.0000 mg | ORAL_TABLET | ORAL | Status: DC | PRN

## 2020-07-20 MED ORDER — POTASSIUM CHLORIDE CRYS ER 20 MEQ PO TBCR
20.0000 meq | EXTENDED_RELEASE_TABLET | Freq: Every day | ORAL | Status: DC
  Administered 2020-07-20: 20 meq via ORAL
  Filled 2020-07-20: qty 1

## 2020-07-20 MED ORDER — DIPHENHYDRAMINE HCL 25 MG PO CAPS
50.0000 mg | ORAL_CAPSULE | Freq: Once | ORAL | Status: AC
  Administered 2020-07-20: 50 mg via ORAL
  Filled 2020-07-20: qty 2

## 2020-07-20 MED ORDER — SODIUM CHLORIDE 0.9 % IV BOLUS
500.0000 mL | Freq: Once | INTRAVENOUS | Status: AC
  Administered 2020-07-20: 500 mL via INTRAVENOUS

## 2020-07-20 MED ORDER — SODIUM CHLORIDE 0.9 % IV SOLN: INTRAVENOUS | Status: DC | PRN

## 2020-07-20 NOTE — ED Provider Notes (Signed)
[facility] EMERGENCY DEPARTMENT Provider Note   CSN: [id number] Arrival date & time: 07/20/20  1103     History Chief Complaint  Patient presents with  . Shortness of Breath    Brianna Erickson is a 54 y.o. female.  HPI    54 year old female with pmhx of obesity, gastric bypass surgery, cholecystectomy presents with concern for leg/abdominal swelling, shortness of breath. Patient states this initially started two months ago. Originally she had swelling of both feet which has progressed up to her lower abdomen. She states she has gained over 50 pounds since then. Following this she developed SOB with productive cough and gagging that would sometimes result in bringing up a fluid like phlegm. She has had intermittent chest tightness but no current chest pain. Denies previous history of arrhythmias, palpations, heart failure, DVT/PE. No recent fever or diarrhea.  History reviewed. No pertinent past medical history.  There are no problems to display for this patient.   Past Surgical History:  Procedure Laterality Date  . CHOLECYSTECTOMY    . GASTRIC BYPASS       OB History   No obstetric history on file.     History reviewed. No pertinent family history.  Social History   Tobacco Use  . Smoking status: Current Every Day Smoker    Types: Cigarettes  Substance Use Topics  . Alcohol use: Yes    Comment: ocassional  . Drug use: Yes    Types: Marijuana    Home Medications Prior to Admission medications   Medication Sig Start Date End Date Taking? Authorizing Provider  ALPRAZolam Prudy Feeler) 1 MG tablet Take 1 mg by mouth every 8 (eight) hours as needed. for anxiety 10/17/17   [provider]  buPROPion (WELLBUTRIN SR) 150 MG 12 hr tablet Take 150 mg by mouth 2 (two) times daily. 10/17/17   [provider]  cyclobenzaprine (FLEXERIL) 10 MG tablet TAKE 1 TABLET BY MOUTH EVERYDAY AT BEDTIME 12/26/17   Kirtland Bouchard, PA-C  methylPREDNISolone (MEDROL)  4 MG tablet Take as directed 10/29/17   Kirtland Bouchard, PA-C  modafinil (PROVIGIL) 200 MG tablet  09/16/17   [provider]  sertraline (ZOLOFT) 100 MG tablet Take 100 mg by mouth daily. 10/16/17   [provider]  SPRINTEC 28 0.25-35 MG-MCG tablet TAKE 1 TABLET BY MOUTH EVERY DAY. NEEDS APPOINTMENT FOR REFILLS 10/15/17   [provider]  VICTOZA 18 MG/3ML SOPN  10/03/17   [provider]  zolpidem (AMBIEN) 10 MG tablet TAKE 1 TABLET AT BEDTIME AS NEEDED FOR SLEEP *ALLOW 10 HOURS BEFORE DRIVING* 6/60/63   [provider]    Allergies    Patient has no known allergies.  Review of Systems   Review of Systems  Constitutional: Positive for fatigue and unexpected weight change.  HENT: Negative for trouble swallowing.   Respiratory: Positive for cough, chest tightness and shortness of breath.   Cardiovascular: Positive for chest pain.  Gastrointestinal: Positive for abdominal distention and vomiting. Negative for constipation and diarrhea.       + abdominal swelling  Musculoskeletal: Positive for myalgias.       BLE edema  Skin: Positive for color change.       Redness to BLE and lower abdomen  Neurological: Negative for headaches.    Physical Exam Updated Vital Signs BP 135/87   Pulse (!) 149   Temp 98.8 F (37.1 C) (Oral)   Resp 17   Ht 5\' 11"  (1.803  m)   Wt (!) 186.2 kg   SpO2 99%   BMI 57.25 kg/m   Physical Exam Constitutional:      Appearance: She is obese.  HENT:     Head: Normocephalic.  Cardiovascular:     Rate and Rhythm: Tachycardia present.  Pulmonary:     Effort: Tachypnea present. No respiratory distress.     Breath sounds: No stridor. Examination of the right-middle field reveals rales. Examination of the left-middle field reveals rales. Examination of the right-lower field reveals decreased breath sounds. Examination of the left-lower field reveals decreased breath sounds and rales. Decreased breath sounds and rales  present.  Chest:     Chest wall: No crepitus.  Abdominal:     Comments: Swollen and slightly distended, diffusely but mildly tender  Musculoskeletal:     Right lower leg: Tenderness present. Edema present.     Left lower leg: Tenderness present. Edema present.  Skin:    Comments: Erythema to BLE  Neurological:     Mental Status: She is oriented to person, place, and time.     ED Results / Procedures / Treatments   Labs (all labs ordered are listed, but only abnormal results are displayed) Labs Reviewed  CBC - Abnormal; Notable for the following components:      Result Value   RBC 3.84 (*)    Hemoglobin 10.6 (*)    HCT 34.0 (*)    RDW 17.2 (*)    All other components within normal limits  BASIC METABOLIC PANEL  PREGNANCY, URINE  APTT  PROTIME-INR  BRAIN NATRIURETIC PEPTIDE  TROPONIN I (HIGH SENSITIVITY)    EKG None  Radiology DG Chest 2 View  Result Date: 07/20/2020 CLINICAL DATA:  Chronic shortness of breath and vomiting. EXAM: CHEST - 2 VIEW COMPARISON:  Chest x-ray dated March 15, 2007. FINDINGS: New mild cardiomegaly with pulmonary vascular congestion and mild interstitial thickening in the mid to lower lungs. No focal consolidation, pleural effusion, or pneumothorax. No acute osseous abnormality. IMPRESSION: 1. Mild congestive heart failure. Electronically Signed   By: Obie Dredge M.D.   On: 07/20/2020 11:54    Procedures Procedures (including critical care time)  Medications Ordered in ED Medications  diltiazem (CARDIZEM) injection 20 mg (has no administration in time range)    ED Course  I have reviewed the triage vital signs and the nursing notes.  Pertinent labs & imaging results that were available during my care of the patient were reviewed by me and considered in my medical decision making (see chart for details).  Clinical Course as of Jul 21 1515  Wed Jul 20, 2020  1434 Patient tachycardic on arrival, appears to be Atrial flutter. She is  sitting up and conversational but does appear SOB. Significant BLE pitting and lower abdomen edema. B/l rales on asucultation. After one dose of cardizem HR briefly improved to low 100s but resumed >140. On second dose her HR responded again to low 100s, pressure slightly soft at 96 systolic. Patient recovered easily but will require drip for HR control. CXR shows cardiomegaly and congestion. Blood work shows baseline anemia, normal troponin,BNP and magnesium. Patient will require admission for HR control and failure evaluation.    [KH]    Clinical Course User Index [KH] Janai Brannigan, Clabe Seal, DO   MDM Rules/Calculators/A&P                          Patient presents with new onset atrial flutter  and heart failure. Remains tachycardic after two cardizem doses although HR did initially respond. Blood work stable, CXR shows mild congestion, no hypoxia or respiratory distress. Will admit on cardizem drip for further evaluation. Final Clinical Impression(s) / ED Diagnoses Final diagnoses:  None    Rx / DC Orders ED Discharge Orders    None       Rozelle Logan, DO 07/20/20 1518

## 2020-07-20 NOTE — ED Triage Notes (Signed)
Pt arrives complaining of shortness of breath for several months and states she has been vomiting every morning for several months as well. Hx gastric bypass. States legs have been swelling, and abdomen in the past few days. Has gained 53lbs in the past 3 months. L. Arm numbness/weakness.

## 2020-07-20 NOTE — ED Notes (Signed)
Pt aware urine collection needed. 

## 2020-07-20 NOTE — H&P (Signed)
History and Physical    Brianna Erickson EXN:[id number] DOB: [birth year] DOA: 07/20/2020  PCP: Patient, No Pcp Per  Patient coming from: [facility]  I have personally briefly reviewed patient's old medical records in [facility]  Chief Complaint: Shortness of breath  HPI: Brianna Erickson is a 54 y.o. female with medical history significant for anxiety/depression, alcohol and tobacco use, obesity s/p gastric bypass surgery who presented to the ED for evaluation of progressive shortness of breath and lower extremity swelling.  Patient reports over the last 2-3 months of having progressive lower extremity swelling which started on her feet and is now up to her abdomen.  She has had an associated 40 pound weight gain over the last 2 months.  She has been having increasing dyspnea on exertion and orthopnea.  She reports cough productive of clear sputum which has been occurring every morning.  She has had decreased appetite.  She has not had any chest pain or palpitations.  She denies any subjective fevers or diaphoresis.  She says she was recently started on Vraylar (second generation antipsychotic) for management of her depression around the time her symptoms began.  She says she is not routinely seeing a primary care physician.  She does see psychiatry for management of her depression/anxiety.  She has been on chronic alprazolam.  She reports tobacco use smoking a pack per day for at least 20 years.  She reports alcohol use, usually drinking a sixpack of beer on Wednesdays and Fridays/Saturdays.  She denies any history of alcohol withdrawal.  She reports occasional marijuana use.  She denies any cocaine or IV drug use.    [facility] ED Course:  Initial vitals showed BP 135/87, pulse 150, RR 17, temp 98.8 Fahrenheit, SPO2 99% on room air.  Labs show sodium 134, potassium 3.7, bicarb 30, BUN <5, creatinine 0.71, serum glucose 137, WBC 7.3, hemoglobin 10.6, platelets  225,000, high-sensitivity troponin I 6x2, BNP 88.7, magnesium 1.8, lactic acid 2.5.  SARS-CoV-2 PCR is negative.  Influenza A/B PCR's are negative.  2 view chest x-ray showed mild cardiomegaly with pulmonary vascular congestion.  Bilateral lower extremity ultrasounds were limited due to body habitus and without evidence of DVT.  Patient was given 500 cc normal saline, IV diltiazem 20 mg x 2 and placed on continuous diltiazem infusion.  She was also given Xanax, Tylenol, and Benadryl.  EKG was notable for atrial flutter with RVR.  The hospitalist service was consulted to admit for further evaluation and management.  Review of Systems: All systems reviewed and are negative except as documented in history of present illness above.   Past Medical History:  Diagnosis Date  . Alcohol use   . Depression with anxiety     Past Surgical History:  Procedure Laterality Date  . CHOLECYSTECTOMY    . GASTRIC BYPASS      Social History:  reports that she has been smoking cigarettes. She does not have any smokeless tobacco history on file. She reports current alcohol use. She reports current drug use. Drug: Marijuana.  No Known Allergies  Family History  Problem Relation Age of Onset  . Heart disease Father      Prior to Admission medications   Medication Sig Start Date End Date Taking? Authorizing Provider  ALPRAZolam Prudy Feeler) 1 MG tablet Take 1 mg by mouth every 8 (eight) hours as needed. for anxiety 10/17/17   [provider]  buPROPion (WELLBUTRIN SR) 150 MG 12  hr tablet Take 150 mg by mouth 2 (two) times daily. 10/17/17   [provider]  cyclobenzaprine (FLEXERIL) 10 MG tablet TAKE 1 TABLET BY MOUTH EVERYDAY AT BEDTIME 12/26/17   Kirtland Bouchardlark, Gilbert W, PA-C  methylPREDNISolone (MEDROL) 4 MG tablet Take as directed 10/29/17   Kirtland Bouchardlark, Gilbert W, PA-C  modafinil (PROVIGIL) 200 MG tablet  09/16/17   [provider]  sertraline (ZOLOFT) 100 MG tablet Take 100 mg by mouth  daily. 10/16/17   [provider]  SPRINTEC 28 0.25-35 MG-MCG tablet TAKE 1 TABLET BY MOUTH EVERY DAY. NEEDS APPOINTMENT FOR REFILLS 10/15/17   [provider]  VICTOZA 18 MG/3ML SOPN  10/03/17   [provider]  zolpidem (AMBIEN) 10 MG tablet TAKE 1 TABLET AT BEDTIME AS NEEDED FOR SLEEP *ALLOW 10 HOURS BEFORE DRIVING* 9/56/212/28/19   [provider]    Physical Exam: Vitals:   07/20/20 1721 07/20/20 1829 07/20/20 2028 07/21/20 0039  BP: (!) 139/91 (!) 131/96 126/80 111/69  Pulse: (!) 143 (!) 145 74 65  Resp: (!) 21 18 16 20   Temp:  98.7 F (37.1 C) 98.3 F (36.8 C) 99.1 F (37.3 C)  TempSrc:  Oral Oral Oral  SpO2: 96% 100% 98% 91%  Weight:      Height:       Constitutional: Obese woman resting in bed with head elevated, NAD, calm, comfortable Eyes: PERRL, lids and conjunctivae normal ENMT: Mucous membranes are moist. Posterior pharynx clear of any exudate or lesions.Normal dentition.  Neck: normal, supple, no masses. Respiratory: clear to auscultation bilaterally, no wheezing, no crackles. Normal respiratory effort. No accessory muscle use.  Cardiovascular: Tachycardic with irregularly irregular rhythm.  +2 bilateral lower extremity edema up to the lower abdomen.  Pedal pulses difficult to palpate due to edema. Abdomen: no tenderness, no masses palpated. No hepatosplenomegaly. Bowel sounds positive.  Musculoskeletal: no clubbing / cyanosis. No joint deformity upper and lower extremities. Good ROM, no contractures. Normal muscle tone.  Skin: no rashes, lesions, ulcers. No induration Neurologic: CN 2-12 grossly intact. Sensation intact, Strength 5/5 in all 4.  Psychiatric: Normal judgment and insight. Alert and oriented x 3. Normal mood.   Labs on Admission: I have personally reviewed following labs and imaging studies  CBC: Recent Labs  Lab 07/20/20 1132  WBC 7.3  HGB 10.6*  HCT 34.0*  MCV 88.5  PLT 225   Basic Metabolic Panel: Recent Labs    Lab 07/20/20 1132 07/20/20 1321  NA 134*  --   K 3.7  --   CL 95*  --   CO2 30  --   GLUCOSE 137*  --   BUN <5*  --   CREATININE 0.71  --   CALCIUM 8.6*  --   MG  --  1.8   GFR: Estimated Creatinine Clearance: 146.8 mL/min (by C-G formula based on SCr of 0.71 mg/dL). Liver Function Tests: Recent Labs  Lab 07/20/20 1132  AST 23  ALT 17  ALKPHOS 102  BILITOT 0.6  PROT 7.6  ALBUMIN 3.2*   No results for input(s): LIPASE, AMYLASE in the last 168 hours. No results for input(s): AMMONIA in the last 168 hours. Coagulation Profile: Recent Labs  Lab 07/20/20 1223  INR 1.2   Cardiac Enzymes: No results for input(s): CKTOTAL, CKMB, CKMBINDEX, TROPONINI in the last 168 hours. BNP (last 3 results) No results for input(s): PROBNP in the last 8760 hours. HbA1C: No results for input(s): HGBA1C in the last 72 hours. CBG: No  results for input(s): GLUCAP in the last 168 hours. Lipid Profile: No results for input(s): CHOL, HDL, LDLCALC, TRIG, CHOLHDL, LDLDIRECT in the last 72 hours. Thyroid Function Tests: No results for input(s): TSH, T4TOTAL, FREET4, T3FREE, THYROIDAB in the last 72 hours. Anemia Panel: No results for input(s): VITAMINB12, FOLATE, FERRITIN, TIBC, IRON, RETICCTPCT in the last 72 hours. Urine analysis: No results found for: COLORURINE, APPEARANCEUR, LABSPEC, PHURINE, GLUCOSEU, HGBUR, BILIRUBINUR, KETONESUR, PROTEINUR, UROBILINOGEN, NITRITE, LEUKOCYTESUR  Radiological Exams on Admission: DG Chest 2 View  Result Date: 07/20/2020 CLINICAL DATA:  Chronic shortness of breath and vomiting. EXAM: CHEST - 2 VIEW COMPARISON:  Chest x-ray dated March 15, 2007. FINDINGS: New mild cardiomegaly with pulmonary vascular congestion and mild interstitial thickening in the mid to lower lungs. No focal consolidation, pleural effusion, or pneumothorax. No acute osseous abnormality. IMPRESSION: 1. Mild congestive heart failure. Electronically Signed   By: Obie Dredge M.D.   On:  07/20/2020 11:54   US Venous Img Lower Bilateral (DVT)  Result Date: 07/20/2020 CLINICAL DATA:  Bilateral leg swelling. EXAM: BILATERAL LOWER EXTREMITY VENOUS DOPPLER ULTRASOUND BILATERAL LOWER EXTREMITY VENOUS DOPPLER ULTRASOUND TECHNIQUE: Gray-scale sonography with graded compression, as well as color Doppler and duplex ultrasound were performed to evaluate the lower extremity deep venous systems from the level of the common femoral vein and including the common femoral, femoral, profunda femoral, popliteal and calf veins including the posterior tibial, peroneal and gastrocnemius veins when visible. The superficial great saphenous vein was also interrogated. Spectral Doppler was utilized to evaluate flow at rest and with distal augmentation maneuvers in the common femoral, femoral and popliteal veins. COMPARISON:  None. FINDINGS: Normal compressibility of the common femoral and superficial femoral veins bilaterally. Visualized portions of profunda femoral vein and great saphenous vein unremarkable bilaterally. The calf veins are not visualized bilaterally secondary to patient body habitus. No filling defects to suggest DVT on grayscale or color Doppler imaging. Doppler waveforms show normal direction of venous flow, normal respiratory plasticity and response to augmentation. Limitations: Evaluation is limited by patient body habitus. Calf veins are not visualized bilaterally. IMPRESSION: Limited evaluation secondary to patient body habitus with nonvisualization of the calf veins bilaterally. Within this limitation, no evidence of DVT. Electronically Signed   By: Feliberto Harts MD   On: 07/20/2020 13:35    EKG: Personally reviewed. Atrial flutter with 2:1 block, rate 148 no prior for comparison.  Assessment/Plan Principal Problem:   Atrial flutter with rapid ventricular response (HCC) Active Problems:   Alcohol use   Depression with anxiety  GENEVA BARRERO is a 54 y.o. female with medical  history significant for anxiety/depression, alcohol and tobacco use, obesity s/p gastric bypass surgery who is admitted with atrial flutter with RVR.  Atrial flutter with RVR: New diagnosis however may have been in atrial flutter and likely CHF since symptom onset about 3 months ago.  CHA2DS2-VASc score is at least 1 pending further work-up. -Continue IV diltiazem -Follow echocardiogram -Replete magnesium and recheck in a.m. -Check TSH  CHF: Patient with likely undiagnosed decompensated CHF given progressive symptoms of dyspnea on exertion, orthopnea, weight gain, and peripheral edema. -Start IV Lasix 40 mg twice daily -Echocardiogram -Monitor strict I/O's and daily weights -Supplement potassium  Depression/anxiety: Continue home Lexapro, Wellbutrin, Vraylar.  Continue Xanax 0.5 mg tid prn with hold parameters.  Alcohol use disorder: Reports drinking a sixpack of beer 3 times a week.  Patient advised on cessation.  Tobacco use: Smoking a pack per day.  Smoking cessation advised.  DVT prophylaxis: Lovenox Code Status: DNR, confirmed with patient Family Communication: Discussed with patient, she has discussed with family Disposition Plan: From home and likely discharge to home pending adequate rate control and diuresis Consults called: None Admission status:  Status is: Inpatient  Remains inpatient appropriate because:Ongoing diagnostic testing needed not appropriate for outpatient work up, IV treatments appropriate due to intensity of illness or inability to take PO and Inpatient level of care appropriate due to severity of illness   Dispo: The patient is from: Home              Anticipated d/c is to: Home              Anticipated d/c date is: 3 days              Patient currently is not medically stable to d/c.   Darreld Mclean MD [facility]  If 7PM-7AM, please contact night-coverage [email or web address]  07/21/2020, 1:17 AM

## 2020-07-20 NOTE — ED Notes (Signed)
[facility] at bedside 

## 2020-07-21 ENCOUNTER — Encounter (HOSPITAL_COMMUNITY): Payer: Self-pay | Admitting: Internal Medicine

## 2020-07-21 ENCOUNTER — Inpatient Hospital Stay (HOSPITAL_COMMUNITY): Payer: 59

## 2020-07-21 DIAGNOSIS — I4892 Unspecified atrial flutter: Secondary | ICD-10-CM | POA: Diagnosis not present

## 2020-07-21 DIAGNOSIS — L309 Dermatitis, unspecified: Secondary | ICD-10-CM | POA: Insufficient documentation

## 2020-07-21 DIAGNOSIS — E785 Hyperlipidemia, unspecified: Secondary | ICD-10-CM | POA: Insufficient documentation

## 2020-07-21 LAB — BASIC METABOLIC PANEL
Anion gap: 10 (ref 5–15)
Anion gap: 10 (ref 5–15)
BUN: <5 mg/dL — ABNORMAL LOW (ref 6–20)
BUN: 5 mg/dL — ABNORMAL LOW (ref 6–20)
CO2: 28 mmol/L (ref 22–32)
CO2: 28 mmol/L (ref 22–32)
Calcium: 8.5 mg/dL — ABNORMAL LOW (ref 8.9–10.3)
Calcium: 8.8 mg/dL — ABNORMAL LOW (ref 8.9–10.3)
Chloride: 98 mmol/L (ref 98–111)
Chloride: 98 mmol/L (ref 98–111)
Creatinine, Ser: 0.68 mg/dL (ref 0.44–1.00)
Creatinine, Ser: 0.64 mg/dL (ref 0.44–1.00)
GFR, Estimated: >60 mL/min (ref >60)
GFR, Estimated: >60 mL/min (ref >60)
Glucose, Bld: 113 mg/dL — ABNORMAL HIGH (ref 70–99)
Glucose, Bld: 119 mg/dL — ABNORMAL HIGH (ref 70–99)
Potassium: 3.2 mmol/L — ABNORMAL LOW (ref 3.5–5.1)
Potassium: 3.2 mmol/L — ABNORMAL LOW (ref 3.5–5.1)
Sodium: 136 mmol/L (ref 135–145)
Sodium: 136 mmol/L (ref 135–145)

## 2020-07-21 LAB — CBC
HCT: 33 % — ABNORMAL LOW (ref 36.0–46.0)
Hemoglobin: 10 g/dL — ABNORMAL LOW (ref 12.0–15.0)
MCH: 27.2 pg (ref 26.0–34.0)
MCHC: 30.3 g/dL (ref 30.0–36.0)
MCV: 89.7 fL (ref 80.0–100.0)
Platelets: 204 10*3/uL (ref 150–400)
RBC: 3.68 MIL/uL — ABNORMAL LOW (ref 3.87–5.11)
RDW: 17.3 % — ABNORMAL HIGH (ref 11.5–15.5)
WBC: 6.4 10*3/uL (ref 4.0–10.5)
nRBC: 0 % (ref 0.0–0.2)

## 2020-07-21 LAB — MAGNESIUM
Magnesium: 2.2 mg/dL (ref 1.7–2.4)
Magnesium: 2.1 mg/dL (ref 1.7–2.4)

## 2020-07-21 LAB — ECHOCARDIOGRAM COMPLETE
Height: 71 in
S' Lateral: 2.3 cm
Weight: 6536.2 oz

## 2020-07-21 LAB — LIPID PANEL
Cholesterol: 85 mg/dL (ref 0–200)
HDL: 23 mg/dL — ABNORMAL LOW (ref >40)
LDL Cholesterol: 49 mg/dL (ref 0–99)
Total CHOL/HDL Ratio: 3.7 RATIO
Triglycerides: 64 mg/dL (ref <150)
VLDL: 13 mg/dL (ref 0–40)

## 2020-07-21 LAB — HEMOGLOBIN A1C
Hgb A1c MFr Bld: 5.9 % — ABNORMAL HIGH (ref 4.8–5.6)
Mean Plasma Glucose: 122.63 mg/dL

## 2020-07-21 LAB — HIV ANTIBODY (ROUTINE TESTING W REFLEX): HIV Screen 4th Generation wRfx: NONREACTIVE

## 2020-07-21 MED ORDER — AMITRIPTYLINE HCL 25 MG PO TABS
50.0000 mg | ORAL_TABLET | Freq: Every evening | ORAL | Status: AC | PRN
  Administered 2020-07-21: 50 mg via ORAL
  Filled 2020-07-21: qty 2

## 2020-07-21 MED ORDER — DILTIAZEM HCL 30 MG PO TABS: 30.0000 mg | ORAL_TABLET | Freq: Four times a day (QID) | ORAL | Status: DC

## 2020-07-21 MED ORDER — DILTIAZEM HCL-DEXTROSE 125-5 MG/125ML-% IV SOLN (PREMIX)
5.0000 mg/h | INTRAVENOUS | Status: DC
  Administered 2020-07-21: 15 mg/h via INTRAVENOUS
  Administered 2020-07-21: 5 mg/h via INTRAVENOUS
  Administered 2020-07-22: 15 mg/h via INTRAVENOUS
  Filled 2020-07-21 (×3): qty 125

## 2020-07-21 MED ORDER — ALUM & MAG HYDROXIDE-SIMETH 200-200-20 MG/5ML PO SUSP
30.0000 mL | ORAL | Status: DC | PRN
  Administered 2020-07-21 – 2020-07-23 (×6): 30 mL via ORAL
  Filled 2020-07-21 (×6): qty 30

## 2020-07-21 MED ORDER — METHOCARBAMOL 500 MG PO TABS
500.0000 mg | ORAL_TABLET | Freq: Three times a day (TID) | ORAL | Status: DC | PRN
  Administered 2020-07-21 (×2): 500 mg via ORAL
  Filled 2020-07-21 (×2): qty 1

## 2020-07-21 MED ORDER — ESCITALOPRAM OXALATE 20 MG PO TABS
20.0000 mg | ORAL_TABLET | Freq: Every day | ORAL | Status: DC
  Administered 2020-07-21 – 2020-07-26 (×6): 20 mg via ORAL
  Filled 2020-07-21 (×7): qty 1

## 2020-07-21 MED ORDER — DILTIAZEM LOAD VIA INFUSION
15.0000 mg | Freq: Once | INTRAVENOUS | Status: AC
  Administered 2020-07-21: 15 mg via INTRAVENOUS
  Filled 2020-07-21: qty 15

## 2020-07-21 MED ORDER — TRAMADOL HCL 50 MG PO TABS
50.0000 mg | ORAL_TABLET | Freq: Four times a day (QID) | ORAL | Status: DC | PRN
  Administered 2020-07-21 – 2020-07-25 (×7): 50 mg via ORAL
  Filled 2020-07-21 (×7): qty 1

## 2020-07-21 MED ORDER — FUROSEMIDE 10 MG/ML IJ SOLN
40.0000 mg | Freq: Three times a day (TID) | INTRAMUSCULAR | Status: DC
  Administered 2020-07-21 – 2020-07-22 (×4): 40 mg via INTRAVENOUS
  Filled 2020-07-21 (×4): qty 4

## 2020-07-21 MED ORDER — CARIPRAZINE HCL 1.5 MG PO CAPS
1.5000 mg | ORAL_CAPSULE | Freq: Every day | ORAL | Status: DC
  Administered 2020-07-21 – 2020-07-26 (×6): 1.5 mg via ORAL
  Filled 2020-07-21 (×6): qty 1

## 2020-07-21 MED ORDER — METOPROLOL TARTRATE 5 MG/5ML IV SOLN: 5.0000 mg | INTRAVENOUS | Status: DC | PRN

## 2020-07-21 MED ORDER — DILTIAZEM HCL 25 MG/5ML IV SOLN: 15.0000 mg | Freq: Once | INTRAVENOUS | Status: DC

## 2020-07-21 MED ORDER — POTASSIUM CHLORIDE CRYS ER 20 MEQ PO TBCR
40.0000 meq | EXTENDED_RELEASE_TABLET | ORAL | Status: AC
  Administered 2020-07-21 (×2): 40 meq via ORAL
  Filled 2020-07-21 (×2): qty 2

## 2020-07-21 MED ORDER — NICOTINE 21 MG/24HR TD PT24
21.0000 mg | MEDICATED_PATCH | Freq: Every day | TRANSDERMAL | Status: DC
  Administered 2020-07-21 – 2020-07-26 (×6): 21 mg via TRANSDERMAL
  Filled 2020-07-21 (×6): qty 1

## 2020-07-21 MED ORDER — POTASSIUM CHLORIDE CRYS ER 20 MEQ PO TBCR
40.0000 meq | EXTENDED_RELEASE_TABLET | ORAL | Status: AC
  Administered 2020-07-21: 40 meq via ORAL
  Filled 2020-07-21: qty 2

## 2020-07-21 MED ORDER — BUPROPION HCL ER (XL) 300 MG PO TB24
300.0000 mg | ORAL_TABLET | Freq: Every day | ORAL | Status: DC
  Administered 2020-07-21 – 2020-07-26 (×6): 300 mg via ORAL
  Filled 2020-07-21 (×6): qty 1

## 2020-07-21 NOTE — Progress Notes (Signed)
[facility] Progress Note  Patient: Brianna Erickson    GLO:[id number]  DOA: 07/20/2020     Date of Service: the patient was seen and examined on 07/21/2020  Brief hospital course: Past medical history of anxiety, depression, alcohol, tobacco abuse, obesity as the gastric bypass.  Presents with complaints of shortness of breath, abdominal pain and lower leg swelling. Found to have A. fib with RVR and acute on chronic diastolic CHF Currently plan is aggressive diuresis.  Assessment and Plan: 1.  A. fib with RVR [place] vas score 3 Continue IV Cardizem. Discussed options for anticoagulation. Monitor for now.  2.  Acute on chronic diastolic CHF Echocardiogram shows preserved EF rather hyperdynamic circulation. Continue aggressive IV diuresis with IV Lasix. Monitor ins and outs.  3.  Hypokalemia, hypomagnesemia. Replacing.  Monitor.  4.  Depression, anxiety Continue home regimen.  5.  Alcohol use disorder 3 times a week.  Drinks 6 cans of beer. Monitor for withdrawal.  6.  Active smoker. Nicotine patch for now.  7.  Morbid obesity. Gastric bypass history. At risk for sleep apnea. Outpatient follow-up with dietary.  Body mass index is 56.98 kg/m.    Interventions:        Diet: Cardiac diet DVT Prophylaxis: Subcutaneous Lovenox      Advance goals of care discussion: DNR  Family Communication: family was present at bedside, at the time of interview.  The pt provided permission to discuss medical plan with the family. Opportunity was given to ask question and all questions were answered satisfactorily.   Disposition:  Status is: Inpatient  Remains inpatient appropriate because:IV treatments appropriate due to intensity of illness or inability to take PO   Dispo: The patient is from: Home              Anticipated d/c is to: Home              Anticipated d/c date is: > 3 days              Patient currently is not medically stable to d/c.         Subjective: Reports abdominal pain.  Reports back pain.  No nausea no vomiting.  Continues to have shortness of breath.  No chest pain.  Physical Exam:  General: Appear in moderate distress, no Rash; Oral Mucosa Clear, moist. no Abnormal Neck Mass Or lumps, Conjunctiva normal  Cardiovascular: S1 and S2 Present, no Murmur, Respiratory: increased respiratory effort, Bilateral Air entry present and bilateral  Crackles, no wheezes Abdomen: Bowel Sound present, Soft and mild skin diffuse tenderness Extremities: bilateral  Pedal edema Neurology: alert and oriented to time, place, and person affect appropriate. no new focal deficit Gait not checked due to patient safety concerns  Vitals:   07/21/20 1035 07/21/20 1159 07/21/20 1249 07/21/20 1646  BP: 102/69 123/85 123/80 116/71  Pulse: (!) 48 (!) 111 (!) 56   Resp: 18  20 18   Temp:  99.5 F (37.5 C) 99.5 F (37.5 C) 97.6 F (36.4 C)  TempSrc:  Oral Oral Oral  SpO2: 96% 94% 92% 95%  Weight:      Height:        Intake/Output Summary (Last 24 hours) at 07/21/2020 1920 Last data filed at 07/21/2020 1600 Gross per 24 hour  Intake 978.16 ml  Output 3875 ml  Net -2896.84 ml   Filed Weights   07/20/20 1112 07/21/20 0600  Weight: (!) 186.2 kg (!) 185.3 kg    Data Reviewed: I  have personally reviewed and interpreted daily labs, tele strips, imagings as discussed above. I reviewed all nursing notes, pharmacy notes, vitals, pertinent old records I have discussed plan of care as described above with RN and patient/family.  CBC: Recent Labs  Lab 07/20/20 1132 07/21/20 0443  WBC 7.3 6.4  HGB 10.6* 10.0*  HCT 34.0* 33.0*  MCV 88.5 89.7  PLT 225 204   Basic Metabolic Panel: Recent Labs  Lab 07/20/20 1132 07/20/20 1321 07/21/20 0443 07/21/20 1721  NA 134*  --  136 136  K 3.7  --  3.2* 3.2*  CL 95*  --  98 98  CO2 30  --  28 28  GLUCOSE 137*  --  113* 119*  BUN <5*  --  <5* 5*  CREATININE 0.71  --  0.68 0.64  CALCIUM 8.6*   --  8.5* 8.8*  MG  --  1.8 2.2 2.1    Studies: ECHOCARDIOGRAM COMPLETE  Result Date: 07/21/2020    ECHOCARDIOGRAM REPORT   Patient Name:   Brianna Erickson Date of Exam: 07/21/2020 Medical Rec #:  [id number]        Height:       71.0 in Accession #:    [id number]       Weight:       408.5 lb Date of Birth:  11/25/1965        BSA:          2.856 m Patient Age:    54 years         BP:           123/85 mmHg Patient Gender: F                HR:           119 bpm. Exam Location:  Inpatient Procedure: 2D Echo Indications:    Atrial Flutter I48.92  History:        Patient has no prior history of Echocardiogram examinations.                 Risk Factors:Current Smoker.  Sonographer:    Thurman Coyer RDCS (AE) Referring Phys: [id number] VISHAL R Eleftheria Taborn IMPRESSIONS  1. Left ventricular ejection fraction, by estimation, is 70 to 75%. The left ventricle has hyperdynamic function. The left ventricle has no regional wall motion abnormalities. There is mild left ventricular hypertrophy. Left ventricular diastolic parameters are indeterminate.  2. Right ventricular systolic function is normal. The right ventricular size is normal. Tricuspid regurgitation signal is inadequate for assessing PA pressure.  3. A small pericardial effusion is present. The pericardial effusion is posterior to the left ventricle.  4. The mitral valve is normal in structure. Trivial mitral valve regurgitation. No evidence of mitral stenosis.  5. The aortic valve is tricuspid. Aortic valve regurgitation is not visualized. No aortic stenosis is present.  6. Aortic dilatation noted. There is mild dilatation of the ascending aorta, measuring 40 mm.  7. The inferior vena cava is dilated in size with <50% respiratory variability, suggesting right atrial pressure of 15 mmHg. FINDINGS  Left Ventricle: Left ventricular ejection fraction, by estimation, is 70 to 75%. The left ventricle has hyperdynamic function. The left ventricle has no regional wall motion  abnormalities. The left ventricular internal cavity size was normal in size. There is mild left ventricular hypertrophy. Left ventricular diastolic parameters are indeterminate. Right Ventricle: The right ventricular size is normal. Right ventricular systolic function is normal. Tricuspid regurgitation signal is inadequate for  assessing PA pressure. The tricuspid regurgitant velocity is 2.13 m/s, and with an assumed right atrial  pressure of 8 mmHg, the estimated right ventricular systolic pressure is 26.1 mmHg. Left Atrium: Left atrial size was normal in size. Right Atrium: Right atrial size was normal in size. Pericardium: A small pericardial effusion is present. The pericardial effusion is posterior to the left ventricle. Mitral Valve: The mitral valve is normal in structure. Trivial mitral valve regurgitation. No evidence of mitral valve stenosis. Tricuspid Valve: The tricuspid valve is normal in structure. Tricuspid valve regurgitation is trivial. No evidence of tricuspid stenosis. Aortic Valve: The aortic valve is tricuspid. Aortic valve regurgitation is not visualized. No aortic stenosis is present. Pulmonic Valve: The pulmonic valve was not well visualized. Pulmonic valve regurgitation is not visualized. No evidence of pulmonic stenosis. Aorta: Aortic dilatation noted. There is mild dilatation of the ascending aorta, measuring 40 mm. Venous: The inferior vena cava is dilated in size with less than 50% respiratory variability, suggesting right atrial pressure of 15 mmHg.  LEFT VENTRICLE PLAX 2D LVIDd:         4.10 cm LVIDs:         2.30 cm LV PW:         1.20 cm LV IVS:        1.20 cm LVOT diam:     2.40 cm LVOT Area:     4.52 cm  LEFT ATRIUM              Index       RIGHT ATRIUM           Index LA diam:        4.60 cm  1.61 cm/m  RA Area:     21.50 cm LA Vol (A2C):   101.0 ml 35.36 ml/m RA Volume:   56.70 ml  19.85 ml/m LA Vol (A4C):   65.2 ml  22.83 ml/m LA Biplane Vol: 80.8 ml  28.29 ml/m   AORTA Ao  Root diam: 3.40 cm TRICUSPID VALVE TR Peak grad:   18.1 mmHg TR Vmax:        213.00 cm/s  SHUNTS Systemic Diam: 2.40 cm Olga Millers MD Electronically signed by Olga Millers MD Signature Date/Time: 07/21/2020/2:48:14 PM    Final     Scheduled Meds: . buPROPion  300 mg Oral Daily  . cariprazine  1.5 mg Oral Daily  . enoxaparin (LOVENOX) injection  90 mg Subcutaneous Q24H  . escitalopram  20 mg Oral Daily  . furosemide  40 mg Intravenous TID  . nicotine  21 mg Transdermal Daily   Continuous Infusions: . sodium chloride    . diltiazem (CARDIZEM) infusion 15 mg/hr (07/21/20 1648)   PRN Meds: sodium chloride, acetaminophen, ALPRAZolam, alum & mag hydroxide-simeth, methocarbamol, ondansetron (ZOFRAN) IV, traMADol  Time spent: 35 minutes  Author: Lynden Oxford, MD [facility] 07/21/2020 7:20 PM  To reach On-call, see care teams to locate the attending and reach out via [email or web address]. Between 7PM-7AM, please contact night-coverage If you still have difficulty reaching the attending provider, please page the [facility] (Director on Call) for [facility] on amion for assistance.

## 2020-07-21 NOTE — Progress Notes (Signed)
  Echocardiogram 2D Echocardiogram has been performed.  Brianna Erickson 07/21/2020, 2:01 PM

## 2020-07-21 NOTE — Progress Notes (Signed)
Telemetry notified this RN of sustained elevated HR in the 140's. I assessed the patient she was sleeping and denied any symptoms. Completed EKG and completed full set of vitals. Notified on call provider Katherina Right) through amnion. Awaiting response.

## 2020-07-21 NOTE — Progress Notes (Signed)
   07/21/20 0300  Assess: MEWS Score  Temp 98.8 F (37.1 C)  BP 111/69  Pulse Rate (!) 124  Resp 20  Level of Consciousness Alert  SpO2 94 %  O2 Device Room Air  Assess: MEWS Score  MEWS Temp 0  MEWS Systolic 0  MEWS Pulse 2  MEWS RR 0  MEWS LOC 0  MEWS Score 2  MEWS Score Color Yellow  Assess: if the MEWS score is Yellow or Red  Were vital signs taken at a resting state? Yes  Focused Assessment No change from prior assessment  Early Detection of Sepsis Score *See Row Information* Low  MEWS guidelines implemented *See Row Information* No, previously yellow, continue vital signs every 4 hours  Treat  MEWS Interventions Other (Comment) (Notified on call provider and EKG)  Notify: Provider  Provider Name/Title Katherina Right  Date Provider Notified 07/21/20  Time Provider Notified 0300  Notification Type Page  Notification Reason Other (Comment) (Sustained increased HR)  Response Other (Comment) (PRN of metoprolol if needed)  Date of Provider Response 07/21/20  Time of Provider Response 0410  Document  Patient Outcome Other (Comment) (Patients HR decreased on it's own)

## 2020-07-22 ENCOUNTER — Ambulatory Visit: Payer: Self-pay | Admitting: Family Medicine

## 2020-07-22 LAB — BASIC METABOLIC PANEL
Anion gap: 12 (ref 5–15)
Anion gap: 10 (ref 5–15)
BUN: 6 mg/dL (ref 6–20)
BUN: 10 mg/dL (ref 6–20)
CO2: 28 mmol/L (ref 22–32)
CO2: 30 mmol/L (ref 22–32)
Calcium: 8.7 mg/dL — ABNORMAL LOW (ref 8.9–10.3)
Calcium: 9.3 mg/dL (ref 8.9–10.3)
Chloride: 96 mmol/L — ABNORMAL LOW (ref 98–111)
Chloride: 96 mmol/L — ABNORMAL LOW (ref 98–111)
Creatinine, Ser: 0.71 mg/dL (ref 0.44–1.00)
Creatinine, Ser: 0.76 mg/dL (ref 0.44–1.00)
GFR, Estimated: >60 mL/min (ref >60)
GFR, Estimated: >60 mL/min (ref >60)
Glucose, Bld: 112 mg/dL — ABNORMAL HIGH (ref 70–99)
Glucose, Bld: 110 mg/dL — ABNORMAL HIGH (ref 70–99)
Potassium: 3.2 mmol/L — ABNORMAL LOW (ref 3.5–5.1)
Potassium: 3.6 mmol/L (ref 3.5–5.1)
Sodium: 136 mmol/L (ref 135–145)
Sodium: 136 mmol/L (ref 135–145)

## 2020-07-22 LAB — MAGNESIUM
Magnesium: 2.1 mg/dL (ref 1.7–2.4)
Magnesium: 2 mg/dL (ref 1.7–2.4)

## 2020-07-22 MED ORDER — METOPROLOL TARTRATE 5 MG/5ML IV SOLN
5.0000 mg | INTRAVENOUS | Status: DC | PRN
  Filled 2020-07-22: qty 5

## 2020-07-22 MED ORDER — FUROSEMIDE 10 MG/ML IJ SOLN
40.0000 mg | Freq: Two times a day (BID) | INTRAMUSCULAR | Status: DC
  Administered 2020-07-23 – 2020-07-26 (×7): 40 mg via INTRAVENOUS
  Filled 2020-07-22 (×7): qty 4

## 2020-07-22 MED ORDER — AMITRIPTYLINE HCL 25 MG PO TABS
50.0000 mg | ORAL_TABLET | Freq: Every day | ORAL | Status: DC
  Administered 2020-07-22 – 2020-07-25 (×4): 50 mg via ORAL
  Filled 2020-07-22 (×4): qty 2

## 2020-07-22 MED ORDER — DILTIAZEM HCL ER 90 MG PO CP12
180.0000 mg | ORAL_CAPSULE | Freq: Two times a day (BID) | ORAL | Status: DC
  Administered 2020-07-22 – 2020-07-23 (×3): 180 mg via ORAL
  Filled 2020-07-22 (×3): qty 2

## 2020-07-22 MED ORDER — POTASSIUM CHLORIDE CRYS ER 20 MEQ PO TBCR
40.0000 meq | EXTENDED_RELEASE_TABLET | Freq: Three times a day (TID) | ORAL | Status: DC
  Administered 2020-07-22 (×3): 40 meq via ORAL
  Filled 2020-07-22 (×3): qty 2

## 2020-07-22 NOTE — TOC Progression Note (Signed)
Transition of Care [facility]) - Progression Note    Patient Details  Name: Brianna Erickson MRN: [id number] Date of Birth: 1966/08/15  [facility] CM/SW Contact  Geni Bers, RN Phone Number: 07/22/2020, 1:55 PM  Clinical Narrative:    Pt from home alone. TOC reviewed chart.    Expected Discharge Plan: Home/Self Care Barriers to Discharge: No Barriers Identified  Expected Discharge Plan and Services Expected Discharge Plan: Home/Self Care                                               Social Determinants of Health (SDOH) Interventions    Readmission Risk Interventions No flowsheet data found.

## 2020-07-22 NOTE — Progress Notes (Signed)
[facility] Progress Note  Patient: Brianna Erickson    BTD:[id number]  DOA: 07/20/2020     Date of Service: the patient was seen and examined on 07/22/2020  Brief hospital course: Past medical history of anxiety, depression, alcohol, tobacco abuse, obesity as the gastric bypass.  Presents with complaints of shortness of breath, abdominal pain and lower leg swelling. Found to have A. fib with RVR and acute on chronic diastolic CHF Currently plan is aggressive diuresis.  Assessment and Plan: 1.  A. fib with RVR [place] vas score 3 Treated with IV Cardizem infusion. Required almost 24 hours of 15 mg/h IV Cardizem. We will transition to 180 mg Cardizem 12-hour tablet twice daily. If tolerates well transition to 360 mg Cardizem tomorrow. Discussed options for anticoagulation.  Patient currently wants to think about it. Prefers to be on daily medicine for now Xarelto. Monitor for now.  2.  Acute on chronic diastolic CHF Echocardiogram shows preserved EF rather hyperdynamic circulation. Continue aggressive IV diuresis with IV Lasix. Monitor ins and outs. So far -7 L.  3.  Hypokalemia, hypomagnesemia. Replacing.  Monitor.  4.  Depression, anxiety Continue home regimen.  Patient is on multiple psych medication.  Patient tells me that she is on all this medication as she kept on complaining that she is not feeling well to her psychiatrist. Her medication may need to be adjusted.  5.  Alcohol use disorder 3 times a week.  Drinks 6 cans of beer. Monitor for withdrawal.  6.  Active smoker. Nicotine patch for now.  7.  Morbid obesity. Gastric bypass history. At risk for sleep apnea. Outpatient follow-up with dietary.  Body mass index is 55.16 kg/m.    Interventions:        Diet: Cardiac diet DVT Prophylaxis: Subcutaneous Lovenox      Advance goals of care discussion: DNR  Family Communication: no family was present at bedside, at the time of interview.    Disposition:  Status is: Inpatient  Remains inpatient appropriate because:IV treatments appropriate due to intensity of illness or inability to take PO   Dispo: The patient is from: Home              Anticipated d/c is to: Home              Anticipated d/c date is: > 3 days              Patient currently is not medically stable to d/c.  Subjective: Abdominal pain improving.  No nausea no vomiting but no fever no chills.  No chest pain. Physical Exam:  General: Appear in mild distress, no Rash; Oral Mucosa Clear, moist. no Abnormal Neck Mass Or lumps, Conjunctiva normal  Cardiovascular: S1 and S2 Present, no Murmur, Respiratory: increased respiratory effort, Bilateral Air entry present and bilateral  Crackles, no wheezes Abdomen: Bowel Sound present, Soft and mild skin diffuse tenderness Extremities: bilateral  Pedal edema Neurology: alert and oriented to time, place, and person affect appropriate. no new focal deficit Gait not checked due to patient safety concerns  Vitals:   07/22/20 0359 07/22/20 0425 07/22/20 0920 07/22/20 1237  BP: 126/77  109/70 107/80  Pulse: 83   65  Resp: (!) 22   15  Temp: 99.4 F (37.4 C)   98.4 F (36.9 C)  TempSrc: Oral   Oral  SpO2: 93%   94%  Weight:  (!) 179.4 kg    Height:        Intake/Output Summary (  Last 24 hours) at 07/22/2020 1929 Last data filed at 07/22/2020 1821 Gross per 24 hour  Intake 480 ml  Output 4400 ml  Net -3920 ml   Filed Weights   07/20/20 1112 07/21/20 0600 07/22/20 0425  Weight: (!) 186.2 kg (!) 185.3 kg (!) 179.4 kg    Data Reviewed: I have personally reviewed and interpreted daily labs, tele strips, imagings as discussed above. I reviewed all nursing notes, pharmacy notes, vitals, pertinent old records I have discussed plan of care as described above with RN and patient/family.  CBC: Recent Labs  Lab 07/20/20 1132 07/21/20 0443  WBC 7.3 6.4  HGB 10.6* 10.0*  HCT 34.0* 33.0*  MCV 88.5 89.7  PLT 225  204   Basic Metabolic Panel: Recent Labs  Lab 07/20/20 1132 07/20/20 1321 07/21/20 0443 07/21/20 1721 07/22/20 0440 07/22/20 1719  NA 134*  --  136 136 136 136  K 3.7  --  3.2* 3.2* 3.2* 3.6  CL 95*  --  98 98 96* 96*  CO2 30  --  28 28 28 30   GLUCOSE 137*  --  113* 119* 112* 110*  BUN <5*  --  <5* 5* 6 10  CREATININE 0.71  --  0.68 0.64 0.71 0.76  CALCIUM 8.6*  --  8.5* 8.8* 8.7* 9.3  MG  --  1.8 2.2 2.1 2.1 2.0    Studies: No results found.  Scheduled Meds: . buPROPion  300 mg Oral Daily  . cariprazine  1.5 mg Oral Daily  . diltiazem  180 mg Oral Q12H  . enoxaparin (LOVENOX) injection  90 mg Subcutaneous Q24H  . escitalopram  20 mg Oral Daily  . [START ON 07/23/2020] furosemide  40 mg Intravenous BID  . nicotine  21 mg Transdermal Daily  . potassium chloride  40 mEq Oral TID   Continuous Infusions: . sodium chloride    . diltiazem (CARDIZEM) infusion 15 mg/hr (07/22/20 0409)   PRN Meds: sodium chloride, acetaminophen, ALPRAZolam, alum & mag hydroxide-simeth, methocarbamol, metoprolol tartrate, ondansetron (ZOFRAN) IV, traMADol  Time spent: 35 minutes  Author: 14/03/21, MD [facility] 07/22/2020 7:29 PM  To reach On-call, see care teams to locate the attending and reach out via [email or web address]. Between 7PM-7AM, please contact night-coverage If you still have difficulty reaching the attending provider, please page the [facility] (Director on Call) for [facility] on amion for assistance.

## 2020-07-22 NOTE — Progress Notes (Addendum)
Patient HR on tele monitor ranging from 135-195. Notified on call provider Katherina Right), she put in a PRN order for Metoprolol for HR >125.  Follow up: patients heart rate has remained within normal limits while she has been awake this morning and I did not have to give the PRN metoprolol. During this shift while the patient was sleeping her heart rate increased into abnormal limits above 125.

## 2020-07-23 LAB — BASIC METABOLIC PANEL
Anion gap: 10 (ref 5–15)
Anion gap: 10 (ref 5–15)
BUN: 9 mg/dL (ref 6–20)
BUN: 13 mg/dL (ref 6–20)
CO2: 30 mmol/L (ref 22–32)
CO2: 29 mmol/L (ref 22–32)
Calcium: 8.8 mg/dL — ABNORMAL LOW (ref 8.9–10.3)
Calcium: 9 mg/dL (ref 8.9–10.3)
Chloride: 96 mmol/L — ABNORMAL LOW (ref 98–111)
Chloride: 97 mmol/L — ABNORMAL LOW (ref 98–111)
Creatinine, Ser: 0.65 mg/dL (ref 0.44–1.00)
Creatinine, Ser: 0.67 mg/dL (ref 0.44–1.00)
GFR, Estimated: >60 mL/min (ref >60)
GFR, Estimated: >60 mL/min (ref >60)
Glucose, Bld: 106 mg/dL — ABNORMAL HIGH (ref 70–99)
Glucose, Bld: 103 mg/dL — ABNORMAL HIGH (ref 70–99)
Potassium: 3.4 mmol/L — ABNORMAL LOW (ref 3.5–5.1)
Potassium: 3.7 mmol/L (ref 3.5–5.1)
Sodium: 136 mmol/L (ref 135–145)
Sodium: 136 mmol/L (ref 135–145)

## 2020-07-23 LAB — CBC
HCT: 34.4 % — ABNORMAL LOW (ref 36.0–46.0)
Hemoglobin: 10.7 g/dL — ABNORMAL LOW (ref 12.0–15.0)
MCH: 27.8 pg (ref 26.0–34.0)
MCHC: 31.1 g/dL (ref 30.0–36.0)
MCV: 89.4 fL (ref 80.0–100.0)
Platelets: 218 10*3/uL (ref 150–400)
RBC: 3.85 MIL/uL — ABNORMAL LOW (ref 3.87–5.11)
RDW: 17.3 % — ABNORMAL HIGH (ref 11.5–15.5)
WBC: 7.2 10*3/uL (ref 4.0–10.5)
nRBC: 0 % (ref 0.0–0.2)

## 2020-07-23 LAB — MAGNESIUM
Magnesium: 2 mg/dL (ref 1.7–2.4)
Magnesium: 2.1 mg/dL (ref 1.7–2.4)

## 2020-07-23 MED ORDER — PANTOPRAZOLE SODIUM 40 MG PO TBEC
40.0000 mg | DELAYED_RELEASE_TABLET | Freq: Two times a day (BID) | ORAL | Status: DC
  Administered 2020-07-23 – 2020-07-26 (×6): 40 mg via ORAL
  Filled 2020-07-23 (×6): qty 1

## 2020-07-23 MED ORDER — POTASSIUM CHLORIDE CRYS ER 20 MEQ PO TBCR
40.0000 meq | EXTENDED_RELEASE_TABLET | Freq: Once | ORAL | Status: AC
  Administered 2020-07-23: 40 meq via ORAL
  Filled 2020-07-23: qty 2

## 2020-07-23 MED ORDER — DILTIAZEM HCL ER 90 MG PO CP12
180.0000 mg | ORAL_CAPSULE | Freq: Two times a day (BID) | ORAL | Status: AC
  Administered 2020-07-23: 180 mg via ORAL
  Filled 2020-07-23: qty 2

## 2020-07-23 MED ORDER — DILTIAZEM HCL ER COATED BEADS 180 MG PO CP24
360.0000 mg | ORAL_CAPSULE | Freq: Every day | ORAL | Status: DC
  Filled 2020-07-23: qty 2

## 2020-07-23 NOTE — Progress Notes (Addendum)
[facility] Progress Note  Patient: Brianna Erickson    BHA:[id number]  DOA: 07/20/2020     Date of Service: the patient was seen and examined on 07/23/2020  Brief hospital course: Past medical history of anxiety, depression, alcohol, tobacco abuse, obesity as the gastric bypass.  Presents with complaints of shortness of breath, abdominal pain and lower leg swelling. Found to have A. fib with RVR and acute on chronic diastolic CHF Currently plan is aggressive diuresis.  Assessment and Plan: 1.  A. fib with RVR Tele shows P-wave, with irregular rate, may have MAT [place] vas score 3 Treated with IV Cardizem infusion. Required almost 24 hours of 15 mg/h IV Cardizem. Transition to 360 mg Cardizem tomorrow. Discussed options for anticoagulation. Patient currently wants to think about it. Prefers to be on daily medicine for now Xarelto. Monitor for now.  2.  Acute on chronic diastolic CHF Echocardiogram shows preserved EF rather hyperdynamic circulation. Continue aggressive IV diuresis with IV Lasix. Monitor ins and outs. So far -10 L.  3.  Hypokalemia, hypomagnesemia Replacing. Monitor.  4.  Depression, anxiety Continue home regimen.   Patient is on multiple psych medication.   Patient tells me that she is on all this medication as she kept on complaining that she is not feeling well to her psychiatrist. Her medication may need to be adjusted.  5.  Alcohol use disorder 3 times a week.  Drinks 6 cans of beer. Monitor for withdrawal.  6.  Active smoker. Nicotine patch for now.  7.  Morbid obesity. Gastric bypass history. At risk for sleep apnea. Outpatient follow-up with dietary. Body mass index is 54.62 kg/m.   8. GERD Add PPI  Diet: Cardiac diet DVT Prophylaxis: Subcutaneous Lovenox   Advance goals of care discussion: DNR  Family Communication: no family was present at bedside, at the time of interview.   Disposition:  Status is: Inpatient  Remains  inpatient appropriate because:IV treatments appropriate due to intensity of illness or inability to take PO  Dispo: The patient is from: Home              Anticipated d/c is to: Home              Anticipated d/c date is: > 3 days              Patient currently is not medically stable to d/c.  Subjective: no shortness of breath, no fever no chest pain. No nausea no vomiting.  Physical Exam:  General: Appear in mild distress, no Rash; Oral Mucosa Clear, moist. no Abnormal Neck Mass Or lumps, Conjunctiva normal  Cardiovascular: S1 and S2 Present, no Murmur, Respiratory: increased respiratory effort, Bilateral Air entry present and bilateral  Crackles, no wheezes Abdomen: Bowel Sound present, Soft and mild skin diffuse tenderness Extremities: bilateral  Pedal edema Neurology: alert and oriented to time, place, and person affect appropriate. no new focal deficit Gait not checked due to patient safety concerns  Vitals:   07/22/20 1237 07/22/20 2142 07/23/20 0603 07/23/20 1322  BP: 107/80 125/71 118/74 117/74  Pulse: 65 73 78 75  Resp: 15 20 18 18   Temp: 98.4 F (36.9 C) 98.2 F (36.8 C) 98.3 F (36.8 C) 98.8 F (37.1 C)  TempSrc: Oral Oral Oral Oral  SpO2: 94% 98% 95% 95%  Weight:   (!) 177.6 kg   Height:        Intake/Output Summary (Last 24 hours) at 07/23/2020 1948 Last data filed at 07/23/2020 1900  Gross per 24 hour  Intake 720 ml  Output 2700 ml  Net -1980 ml   Filed Weights   07/21/20 0600 07/22/20 0425 07/23/20 0603  Weight: (!) 185.3 kg (!) 179.4 kg (!) 177.6 kg    Data Reviewed: I have personally reviewed and interpreted daily labs, tele strips, imagings as discussed above. I reviewed all nursing notes, pharmacy notes, vitals, pertinent old records I have discussed plan of care as described above with RN and patient/family.  CBC: Recent Labs  Lab 07/20/20 1132 07/21/20 0443 07/23/20 0553  WBC 7.3 6.4 7.2  HGB 10.6* 10.0* 10.7*  HCT 34.0* 33.0* 34.4*  MCV  88.5 89.7 89.4  PLT 225 204 218   Basic Metabolic Panel: Recent Labs  Lab 07/21/20 1721 07/22/20 0440 07/22/20 1719 07/23/20 0553 07/23/20 1644  NA 136 136 136 136 136  K 3.2* 3.2* 3.6 3.4* 3.7  CL 98 96* 96* 96* 97*  CO2 28 28 30 30 29   GLUCOSE 119* 112* 110* 106* 103*  BUN 5* 6 10 9 13   CREATININE 0.64 0.71 0.76 0.65 0.67  CALCIUM 8.8* 8.7* 9.3 8.8* 9.0  MG 2.1 2.1 2.0 2.0 2.1   Studies: No results found.  Scheduled Meds: . amitriptyline  50 mg Oral QHS  . buPROPion  300 mg Oral Daily  . cariprazine  1.5 mg Oral Daily  . [START ON 07/24/2020] diltiazem  360 mg Oral Daily  . diltiazem  180 mg Oral Q12H  . enoxaparin (LOVENOX) injection  90 mg Subcutaneous Q24H  . escitalopram  20 mg Oral Daily  . furosemide  40 mg Intravenous BID  . nicotine  21 mg Transdermal Daily  . pantoprazole  40 mg Oral BID AC   Continuous Infusions: . sodium chloride     PRN Meds: sodium chloride, acetaminophen, ALPRAZolam, alum & mag hydroxide-simeth, methocarbamol, metoprolol tartrate, ondansetron (ZOFRAN) IV, traMADol  Time spent: 35 minutes  Author: , MD [facility] 07/23/2020 7:48 PM  To reach On-call, see care teams to locate the attending and reach out via [email or web address]. Between 7PM-7AM, please contact night-coverage If you still have difficulty reaching the attending provider, please page the [facility] (Director on Call) for [facility] on amion for assistance.

## 2020-07-24 LAB — CBC
HCT: 34.2 % — ABNORMAL LOW (ref 36.0–46.0)
Hemoglobin: 10.5 g/dL — ABNORMAL LOW (ref 12.0–15.0)
MCH: 27.3 pg (ref 26.0–34.0)
MCHC: 30.7 g/dL (ref 30.0–36.0)
MCV: 89.1 fL (ref 80.0–100.0)
Platelets: 241 10*3/uL (ref 150–400)
RBC: 3.84 MIL/uL — ABNORMAL LOW (ref 3.87–5.11)
RDW: 17.4 % — ABNORMAL HIGH (ref 11.5–15.5)
WBC: 7.5 10*3/uL (ref 4.0–10.5)
nRBC: 0 % (ref 0.0–0.2)

## 2020-07-24 LAB — BASIC METABOLIC PANEL
Anion gap: 10 (ref 5–15)
BUN: 13 mg/dL (ref 6–20)
CO2: 29 mmol/L (ref 22–32)
Calcium: 9.1 mg/dL (ref 8.9–10.3)
Chloride: 98 mmol/L (ref 98–111)
Creatinine, Ser: 0.68 mg/dL (ref 0.44–1.00)
GFR, Estimated: >60 mL/min (ref >60)
Glucose, Bld: 120 mg/dL — ABNORMAL HIGH (ref 70–99)
Potassium: 3.1 mmol/L — ABNORMAL LOW (ref 3.5–5.1)
Sodium: 137 mmol/L (ref 135–145)

## 2020-07-24 LAB — MAGNESIUM: Magnesium: 2 mg/dL (ref 1.7–2.4)

## 2020-07-24 MED ORDER — LIVING BETTER WITH HEART FAILURE BOOK: Freq: Once | Status: AC

## 2020-07-24 MED ORDER — METOPROLOL TARTRATE 50 MG PO TABS
50.0000 mg | ORAL_TABLET | Freq: Two times a day (BID) | ORAL | Status: DC
  Administered 2020-07-24 – 2020-07-26 (×5): 50 mg via ORAL
  Filled 2020-07-24 (×5): qty 1

## 2020-07-24 MED ORDER — RIVAROXABAN 20 MG PO TABS
20.0000 mg | ORAL_TABLET | Freq: Every day | ORAL | Status: DC
  Administered 2020-07-24 – 2020-07-25 (×2): 20 mg via ORAL
  Filled 2020-07-24 (×2): qty 1

## 2020-07-24 MED ORDER — POTASSIUM CHLORIDE CRYS ER 20 MEQ PO TBCR
40.0000 meq | EXTENDED_RELEASE_TABLET | Freq: Two times a day (BID) | ORAL | Status: DC
  Administered 2020-07-24 – 2020-07-26 (×4): 40 meq via ORAL
  Filled 2020-07-24 (×4): qty 2

## 2020-07-24 MED ORDER — IBUPROFEN 200 MG PO TABS
400.0000 mg | ORAL_TABLET | Freq: Once | ORAL | Status: AC
  Administered 2020-07-24: 400 mg via ORAL
  Filled 2020-07-24: qty 2

## 2020-07-24 MED ORDER — BENZOCAINE 10 % MT GEL
Freq: Four times a day (QID) | OROMUCOSAL | Status: DC
  Filled 2020-07-24: qty 9.4

## 2020-07-24 NOTE — Progress Notes (Signed)
[facility] Progress Note  Patient: Brianna Erickson    WUJ:[id number]  DOA: 07/20/2020     Date of Service: the patient was seen and examined on 07/24/2020  Brief hospital course: Past medical history of anxiety, depression, alcohol, tobacco abuse, obesity as the gastric bypass.  Presents with complaints of shortness of breath, abdominal pain and lower leg swelling. Found to have A. fib with RVR and acute on chronic diastolic CHF Currently plan is aggressive diuresis.  Assessment and Plan: 1.  A. fib, a flutter with RVR [place] vas score 3 Treated with IV Cardizem infusion. Required almost 24 hours of 15 mg/h IV Cardizem. Transition to 360 mg Cardizem.  Patient unable to tolerate it the capsule. We will transition to Lopressor and monitor. Discussed options for anticoagulation. Prefers to be on daily medicine for now Xarelto. Outpatient follow-up with cardiology recommended.  2.  Acute on chronic diastolic CHF Echocardiogram shows preserved EF rather hyperdynamic circulation. Continue aggressive IV diuresis with IV Lasix. Monitor ins and outs. So far -12 L.  3.  Hypokalemia, hypomagnesemia Replacing. Monitor.  4.  Depression, anxiety Continue home regimen.   Patient is on multiple psych medication.   Patient tells me that she is on all this medication as she kept on complaining that she is not feeling well to her psychiatrist. Her medication may need to be adjusted.  5.  Alcohol use disorder 3 times a week.  Drinks 6 cans of beer. Monitor for withdrawal.  6.  Active smoker. Nicotine patch for now.  7.  Morbid obesity. Gastric bypass history. At risk for sleep apnea. Outpatient follow-up with dietary. Body mass index is 54.62 kg/m.   8. GERD Add PPI  9.  Dental pain. Continue Orajel.  10.  Nocturnal hypoxia. We will check nocturnal oximetry and monitor.  Diet: Cardiac diet DVT Prophylaxis: Subcutaneous Lovenox   Advance goals of care discussion: DNR   Family Communication: no family was present at bedside, at the time of interview.   Disposition:  Status is: Inpatient  Remains inpatient appropriate because:IV treatments appropriate due to intensity of illness or inability to take PO  Dispo: The patient is from: Home              Anticipated d/c is to: Home              Anticipated d/c date is: > 3 days              Patient currently is not medically stable to d/c.  Subjective: No acute complaint.  Reports dental pain since admission.  No nausea no vomiting.  Breathing is good.  No blood in the stool.  Physical Exam:  General: Appear in mild distress, no Rash; Oral Mucosa Clear, moist. no Abnormal Neck Mass Or lumps, Conjunctiva normal  Cardiovascular: S1 and S2 Present, no Murmur, Respiratory: increased respiratory effort, Bilateral Air entry present and bilateral  Crackles, no wheezes Abdomen: Bowel Sound present, Soft and mild skin diffuse tenderness Extremities: bilateral  Pedal edema Neurology: alert and oriented to time, place, and person affect appropriate. no new focal deficit Gait not checked due to patient safety concerns  Vitals:   07/23/20 1322 07/23/20 2228 07/24/20 0649 07/24/20 1254  BP: 117/74 130/84 113/69 104/72  Pulse: 75 90 77 84  Resp: 18 16 18 17   Temp: 98.8 F (37.1 C) 98.4 F (36.9 C) 98 F (36.7 C) 98.8 F (37.1 C)  TempSrc: Oral Oral Oral Oral  SpO2: 95% 96% 95%  96%  Weight:      Height:        Intake/Output Summary (Last 24 hours) at 07/24/2020 1916 Last data filed at 07/24/2020 1724 Gross per 24 hour  Intake 240 ml  Output 2901 ml  Net -2661 ml   Filed Weights   07/21/20 0600 07/22/20 0425 07/23/20 0603  Weight: (!) 185.3 kg (!) 179.4 kg (!) 177.6 kg    Data Reviewed: I have personally reviewed and interpreted daily labs, tele strips, imagings as discussed above. I reviewed all nursing notes, pharmacy notes, vitals, pertinent old records I have discussed plan of care as described  above with RN and patient/family.  CBC: Recent Labs  Lab 07/20/20 1132 07/21/20 0443 07/23/20 0553 07/24/20 0438  WBC 7.3 6.4 7.2 7.5  HGB 10.6* 10.0* 10.7* 10.5*  HCT 34.0* 33.0* 34.4* 34.2*  MCV 88.5 89.7 89.4 89.1  PLT 225 204 218 241   Basic Metabolic Panel: Recent Labs  Lab 07/22/20 0440 07/22/20 1719 07/23/20 0553 07/23/20 1644 07/24/20 1122  NA 136 136 136 136 137  K 3.2* 3.6 3.4* 3.7 3.1*  CL 96* 96* 96* 97* 98  CO2 28 30 30 29 29   GLUCOSE 112* 110* 106* 103* 120*  BUN 6 10 9 13 13   CREATININE 0.71 0.76 0.65 0.67 0.68  CALCIUM 8.7* 9.3 8.8* 9.0 9.1  MG 2.1 2.0 2.0 2.1 2.0   Studies: No results found.  Scheduled Meds: . amitriptyline  50 mg Oral QHS  . benzocaine   Mouth/Throat QID  . buPROPion  300 mg Oral Daily  . cariprazine  1.5 mg Oral Daily  . escitalopram  20 mg Oral Daily  . furosemide  40 mg Intravenous BID  . metoprolol tartrate  50 mg Oral BID  . nicotine  21 mg Transdermal Daily  . pantoprazole  40 mg Oral BID AC  . potassium chloride  40 mEq Oral BID  . rivaroxaban  20 mg Oral Q supper   Continuous Infusions: . sodium chloride     PRN Meds: sodium chloride, acetaminophen, ALPRAZolam, alum & mag hydroxide-simeth, methocarbamol, metoprolol tartrate, ondansetron (ZOFRAN) IV, traMADol  Time spent: 35 minutes  Author: , MD [facility] 07/24/2020 7:16 PM  To reach On-call, see care teams to locate the attending and reach out via [email or web address]. Between 7PM-7AM, please contact night-coverage If you still have difficulty reaching the attending provider, please page the [facility] (Director on Call) for [facility] on amion for assistance.

## 2020-07-24 NOTE — Progress Notes (Signed)
RT unable to do overnight pulse ox due to another patient getting the same test. Respiratory only has 1 machine.

## 2020-07-24 NOTE — Plan of Care (Signed)

## 2020-07-24 NOTE — Progress Notes (Addendum)
ANTICOAGULATION CONSULT NOTE - Initial Consult  Pharmacy Consult for xarelto Indication: atrial fibrillation  No Known Allergies  Patient Measurements: Height: 5\' 11"  (180.3 cm) Weight: (!) 177.6 kg (391 lb 9.6 oz) IBW/kg (Calculated) : 70.8 Heparin Dosing Weight:   Vital Signs: Temp: 98.8 F (37.1 C) (12/05 1254) Temp Source: Oral (12/05 1254) BP: 104/72 (12/05 1254) Pulse Rate: 84 (12/05 1254)  Labs: Recent Labs    07/23/20 0553 07/23/20 1644 07/24/20 0438 07/24/20 1122  HGB 10.7*  --  10.5*  --   HCT 34.4*  --  34.2*  --   PLT 218  --  241  --   CREATININE 0.65 0.67  --  0.68    Estimated Creatinine Clearance: 142.4 mL/min (by C-G formula based on SCr of 0.68 mg/dL).   Medical History: Past Medical History:  Diagnosis Date  . Alcohol use   . Depression with anxiety     Medications:  Medications Prior to Admission  Medication Sig Dispense Refill Last Dose  . ALPRAZolam (XANAX) 0.5 MG tablet Take 0.5 mg by mouth 3 (three) times daily as needed for anxiety.    07/20/2020 at Unknown time  . amitriptyline (ELAVIL) 25 MG tablet Take 50 mg by mouth at bedtime.   07/19/2020 at Unknown time  . buPROPion (WELLBUTRIN XL) 300 MG 24 hr tablet Take 300 mg by mouth daily.   07/19/2020 at Unknown time  . escitalopram (LEXAPRO) 20 MG tablet Take 20 mg by mouth daily.   07/19/2020 at Unknown time  . VRAYLAR capsule Take 1.5 mg by mouth daily.   07/19/2020 at Unknown time    Assessment: 54 yo F to start Xarelto for Afib.   Hg 10.5 is stable, PLT WNL, SCr WNL,  WT 177. 6 kg    Plan:  DC LMWH 90 q24 for VTE px Start xarelto 20 qsupper today Will educate and give 30 day free card prior to discharge  52, Pharm.D 07/24/2020 4:09 PM

## 2020-07-24 NOTE — Discharge Instructions (Signed)

## 2020-07-25 ENCOUNTER — Inpatient Hospital Stay: Payer: 59

## 2020-07-25 DIAGNOSIS — Z23 Encounter for immunization: Secondary | ICD-10-CM

## 2020-07-25 LAB — CBC
HCT: 36.2 % (ref 36.0–46.0)
Hemoglobin: 11 g/dL — ABNORMAL LOW (ref 12.0–15.0)
MCH: 27 pg (ref 26.0–34.0)
MCHC: 30.4 g/dL (ref 30.0–36.0)
MCV: 88.7 fL (ref 80.0–100.0)
Platelets: 249 10*3/uL (ref 150–400)
RBC: 4.08 MIL/uL (ref 3.87–5.11)
RDW: 17.2 % — ABNORMAL HIGH (ref 11.5–15.5)
WBC: 7.1 10*3/uL (ref 4.0–10.5)
nRBC: 0 % (ref 0.0–0.2)

## 2020-07-25 LAB — BASIC METABOLIC PANEL
Anion gap: 11 (ref 5–15)
BUN: 14 mg/dL (ref 6–20)
CO2: 29 mmol/L (ref 22–32)
Calcium: 8.9 mg/dL (ref 8.9–10.3)
Chloride: 98 mmol/L (ref 98–111)
Creatinine, Ser: 0.66 mg/dL (ref 0.44–1.00)
GFR, Estimated: >60 mL/min (ref >60)
Glucose, Bld: 104 mg/dL — ABNORMAL HIGH (ref 70–99)
Potassium: 3.2 mmol/L — ABNORMAL LOW (ref 3.5–5.1)
Sodium: 138 mmol/L (ref 135–145)

## 2020-07-25 MED ORDER — HYDROCODONE-ACETAMINOPHEN 5-325 MG PO TABS
1.0000 | ORAL_TABLET | ORAL | Status: DC | PRN
  Administered 2020-07-25 – 2020-07-26 (×5): 1 via ORAL
  Filled 2020-07-25 (×6): qty 1

## 2020-07-25 NOTE — Progress Notes (Signed)
   Covid-19 Vaccination Clinic  Name:  Brianna Erickson    MRN: [id number] DOB: [birth year]  07/25/2020  Brianna Erickson was observed post Covid-19 immunization for 15 minutes without incident. She was provided with Vaccine Information Sheet and instruction to access the V-Safe system.   Brianna Erickson was instructed to call 911 with any severe reactions post vaccine: Marland Kitchen Difficulty breathing  . Swelling of face and throat  . A fast heartbeat  . A bad rash all over body  . Dizziness and weakness   Immunizations Administered    Name Date Dose VIS Date Route   Pfizer COVID-19 Vaccine 07/25/2020 11:44 AM 0.3 mL 06/08/2020 Intramuscular   Manufacturer: [employer], [employer]   Lot: [id number]   NDC: [address]-8

## 2020-07-25 NOTE — Progress Notes (Signed)
[facility] Progress Note  Patient: Brianna Erickson    PYP:[id number]  DOA: 07/20/2020     Date of Service: the patient was seen and examined on 07/25/2020  Brief hospital course: Past medical history of anxiety, depression, alcohol, tobacco abuse, obesity as the gastric bypass.  Presents with complaints of shortness of breath, abdominal pain and lower leg swelling. Found to have A. fib with RVR and acute on chronic diastolic CHF Currently plan is aggressive diuresis.  Assessment and Plan: 1.  A. fib, a flutter with RVR [place] vas score 3 Treated with IV Cardizem infusion. Required almost 24 hours of 15 mg/h IV Cardizem. Transition to 360 mg Cardizem.  Patient unable to tolerate the capsule. Tolerating Lopressor and rate remained controlled on Lopressor. Discussed options for anticoagulation. Prefers to be on daily medicine for now Xarelto. Outpatient follow-up with cardiology recommended.  2.  Acute on chronic diastolic CHF Echocardiogram shows preserved EF rather hyperdynamic circulation. Continue aggressive IV diuresis with IV Lasix. Monitor ins and outs. So far -16 L.  3.  Hypokalemia, hypomagnesemia Replacing. Monitor.  4.  Depression, anxiety Continue home regimen.   Patient is on multiple psych medication.   Patient tells me that she is on all this medication as she kept on complaining that she is not feeling well to her psychiatrist. Her medication may need to be adjusted.  5.  Alcohol use disorder 3 times a week.  Drinks 6 cans of beer. Monitor for withdrawal.  6.  Active smoker. Nicotine patch for now.  7.  Morbid obesity. Gastric bypass history. At risk for sleep apnea. Outpatient follow-up with dietary. Body mass index is 54.62 kg/m.   8. GERD Add PPI  9.  Dental pain. Continue Orajel.  10.  Nocturnal hypoxia. We will check nocturnal oximetry and monitor.  11.  Received Pfizer vaccine first dose this admission on 07/25/2020. Requesting  pneumonia and flu shot as well.  12.  Patient will need time off from work so that she can maintain follow-up with providers.  13.  Dental pain. Unable to be controlled with tramadol. Unable to use NSAIDs given anticoagulation Change tramadol to Norco.  Diet: Cardiac diet DVT Prophylaxis: Subcutaneous Lovenox   Advance goals of care discussion: DNR  Family Communication: no family was present at bedside, at the time of interview.   Disposition:  Status is: Inpatient  Remains inpatient appropriate because:IV treatments appropriate due to intensity of illness or inability to take PO  Dispo: The patient is from: Home              Anticipated d/c is to: Home              Anticipated d/c date is: > 3 days              Patient currently is not medically stable to d/c.  Subjective: Continues to have dental pain.  No nausea no vomiting.  No fever no chills.  Physical Exam:  General: Appear in mild distress, no Rash; Oral Mucosa Clear, moist. no Abnormal Neck Mass Or lumps, Conjunctiva normal  Cardiovascular: S1 and S2 Present, no Murmur, Respiratory: increased respiratory effort, Bilateral Air entry present and bilateral  Crackles, no wheezes Abdomen: Bowel Sound present, Soft and mild skin diffuse tenderness Extremities: bilateral  Pedal edema Neurology: alert and oriented to time, place, and person affect appropriate. no new focal deficit Gait not checked due to patient safety concerns  Vitals:   07/24/20 1254 07/24/20 2046 07/25/20 0551  07/25/20 1242  BP: 104/72 (!) 122/92 108/65 112/72  Pulse: 84 78 68 69  Resp: 17 18  14   Temp: 98.8 F (37.1 C) 98.1 F (36.7 C) 98.6 F (37 C) 99.4 F (37.4 C)  TempSrc: Oral Oral Oral Oral  SpO2: 96% 97% 97% 95%  Weight:      Height:        Intake/Output Summary (Last 24 hours) at 07/25/2020 2000 Last data filed at 07/25/2020 1754 Gross per 24 hour  Intake 220 ml  Output 3900 ml  Net -3680 ml   Filed Weights   07/21/20 0600  07/22/20 0425 07/23/20 0603  Weight: (!) 185.3 kg (!) 179.4 kg (!) 177.6 kg    Data Reviewed: I have personally reviewed and interpreted daily labs, tele strips, imagings as discussed above. I reviewed all nursing notes, pharmacy notes, vitals, pertinent old records I have discussed plan of care as described above with RN and patient/family.  CBC: Recent Labs  Lab 07/20/20 1132 07/21/20 0443 07/23/20 0553 07/24/20 0438 07/25/20 0446  WBC 7.3 6.4 7.2 7.5 7.1  HGB 10.6* 10.0* 10.7* 10.5* 11.0*  HCT 34.0* 33.0* 34.4* 34.2* 36.2  MCV 88.5 89.7 89.4 89.1 88.7  PLT 225 204 218 241 249   Basic Metabolic Panel: Recent Labs  Lab 07/22/20 0440 07/22/20 0440 07/22/20 1719 07/23/20 0553 07/23/20 1644 07/24/20 1122 07/25/20 0446  NA 136   < > 136 136 136 137 138  K 3.2*   < > 3.6 3.4* 3.7 3.1* 3.2*  CL 96*   < > 96* 96* 97* 98 98  CO2 28   < > 30 30 29 29 29   GLUCOSE 112*   < > 110* 106* 103* 120* 104*  BUN 6   < > 10 9 13 13 14   CREATININE 0.71   < > 0.76 0.65 0.67 0.68 0.66  CALCIUM 8.7*   < > 9.3 8.8* 9.0 9.1 8.9  MG 2.1  --  2.0 2.0 2.1 2.0  --    < > = values in this interval not displayed.   Studies: No results found.  Scheduled Meds: . amitriptyline  50 mg Oral QHS  . benzocaine   Mouth/Throat QID  . buPROPion  300 mg Oral Daily  . cariprazine  1.5 mg Oral Daily  . escitalopram  20 mg Oral Daily  . furosemide  40 mg Intravenous BID  . metoprolol tartrate  50 mg Oral BID  . nicotine  21 mg Transdermal Daily  . pantoprazole  40 mg Oral BID AC  . potassium chloride  40 mEq Oral BID  . rivaroxaban  20 mg Oral Q supper   Continuous Infusions: . sodium chloride     PRN Meds: sodium chloride, acetaminophen, ALPRAZolam, alum & mag hydroxide-simeth, HYDROcodone-acetaminophen, methocarbamol, metoprolol tartrate, ondansetron (ZOFRAN) IV  Time spent: 35 minutes  Author: 14/06/21, MD [facility] 07/25/2020 8:00 PM  To reach On-call, see care teams to  locate the attending and reach out via www. . Between 7PM-7AM, please contact night-coverage If you still have difficulty reaching the attending provider, please page the [facility] (Director on Call) for [facility] on amion for assistance.

## 2020-07-25 NOTE — Plan of Care (Signed)

## 2020-07-25 NOTE — Progress Notes (Signed)
PHARMACY NOTE -  Xarelto  Pharmacy has been assisting with dosing of Xarelto for new Afib.  Dosage remains stable at 20 mg PO daily and need for further dosage adjustment appears unlikely at present given SCr and CBC at baseline.  Pharmacy will sign off, following peripherally for culture results or dose adjustments. Please reconsult if a change in clinical status warrants re-evaluation of dosage.  Bernadene Person, PharmD, BCPS [phone] 07/25/2020, 2:59 PM

## 2020-07-25 NOTE — Evaluation (Signed)
Physical Therapy Evaluation Patient Details Name: Brianna Erickson MRN: [id number] DOB: 10-09-1965 Today's Date: 07/25/2020   History of Present Illness  54 yo female admitted with Aflutter with RVR. Hx of depression, anxiety, gastic bypass, CHF, obesity, cholecytectomy  Clinical Impression  On eval, pt was Supv level for mobility on today. She walked ~150 feet around the unit. Dyspnea 2/4, HR 89 bpm with ambulation. Pt participated well. Discussed d/c plan-pt reports having difficulty with ADLs prior to admission. She has experiencing 2 falls within the last 6 months and that has made her fearful at home where she lives alone. She is agreeable to [facility] f/u to help her regain her independence and to allow her to safely and efficiently perform ADLs. Will recommend HHPT, HHOT, and a home health aide (if possible). Will plan to follow during hospital stay.     Follow Up Recommendations Home health PT;HHOT; Home health Aide    Equipment Recommendations  None recommended by PT    Recommendations for Other Services       Precautions / Restrictions Precautions Precautions: Fall Restrictions Weight Bearing Restrictions: No      Mobility  Bed Mobility Overal bed mobility: Modified Independent                  Transfers Overall transfer level: Modified independent                  Ambulation/Gait Ambulation/Gait assistance: Supervision Gait Distance (Feet): 150 Feet Assistive device: None       General Gait Details: Dyspnea 2/4. HR 89 bpm. Mildly unsteady but no overt LOB. Wide BOS.  Stairs            Wheelchair Mobility    Modified Rankin (Stroke Patients Only)       Balance Overall balance assessment: Needs assistance   Sitting balance-Leahy Scale: Normal       Standing balance-Leahy Scale: Fair                               Pertinent Vitals/Pain Pain Assessment: Faces Faces Pain Scale: Hurts even more Pain Location:  toothache Pain Descriptors / Indicators: Aching    Home Living Family/patient expects to be discharged to:: Private residence Living Arrangements: Alone   Type of Home: House Home Access: Stairs to enter Entrance Stairs-Rails: Right Entrance Stairs-Number of Steps: 4-5 Home Layout: One level Home Equipment: Walker - 2 wheels;Cane - single point      Prior Function Level of Independence: Independent         Comments: has had difficulty with bathing recently. 2 falls within last 6 months.     Hand Dominance        Extremity/Trunk Assessment   Upper Extremity Assessment Upper Extremity Assessment: Overall WFL for tasks assessed    Lower Extremity Assessment Lower Extremity Assessment: Generalized weakness (neuropathy)    Cervical / Trunk Assessment Cervical / Trunk Assessment: Normal  Communication   Communication: No difficulties  Cognition Arousal/Alertness: Awake/alert Behavior During Therapy: WFL for tasks assessed/performed Overall Cognitive Status: Within Functional Limits for tasks assessed                                        General Comments      Exercises     Assessment/Plan    PT Assessment Patient needs continued PT  services  PT Problem List Decreased balance;Decreased activity tolerance       PT Treatment Interventions DME instruction;Gait training;Therapeutic activities;Therapeutic exercise;Patient/family education;Balance training;Functional mobility training    PT Goals (Current goals can be found in the Care Plan section)  Acute Rehab PT Goals Patient Stated Goal: to get some help at home. to get better and improve quality of lifr PT Goal Formulation: With patient Time For Goal Achievement: 08/08/20 Potential to Achieve Goals: Good    Frequency Min 3X/week   Barriers to discharge Decreased caregiver support      Co-evaluation               AM-PAC PT "6 Clicks" Mobility  Outcome Measure Help needed  turning from your back to your side while in a flat bed without using bedrails?: None Help needed moving from lying on your back to sitting on the side of a flat bed without using bedrails?: None Help needed moving to and from a bed to a chair (including a wheelchair)?: A Little Help needed standing up from a chair using your arms (e.g., wheelchair or bedside chair)?: A Little Help needed to walk in hospital room?: A Little Help needed climbing 3-5 steps with a railing? : A Little 6 Click Score: 20    End of Session   Activity Tolerance: Patient tolerated treatment well Patient left: in bed;with call bell/phone within reach   PT Visit Diagnosis: Unsteadiness on feet (R26.81);History of falling (Z91.81)    Time: 0947-1000 PT Time Calculation (min) (ACUTE ONLY): 13 min   Charges:   PT Evaluation $PT Eval Moderate Complexity: 1 Mod            Faye Ramsay, PT Acute Rehabilitation  Office: [phone] Pager: [phone]

## 2020-07-26 LAB — CBC
HCT: 35.8 % — ABNORMAL LOW (ref 36.0–46.0)
Hemoglobin: 10.9 g/dL — ABNORMAL LOW (ref 12.0–15.0)
MCH: 27 pg (ref 26.0–34.0)
MCHC: 30.4 g/dL (ref 30.0–36.0)
MCV: 88.6 fL (ref 80.0–100.0)
Platelets: 255 10*3/uL (ref 150–400)
RBC: 4.04 MIL/uL (ref 3.87–5.11)
RDW: 17.4 % — ABNORMAL HIGH (ref 11.5–15.5)
WBC: 7.1 10*3/uL (ref 4.0–10.5)
nRBC: 0 % (ref 0.0–0.2)

## 2020-07-26 LAB — BASIC METABOLIC PANEL
Anion gap: 9 (ref 5–15)
BUN: 20 mg/dL (ref 6–20)
CO2: 31 mmol/L (ref 22–32)
Calcium: 9.1 mg/dL (ref 8.9–10.3)
Chloride: 98 mmol/L (ref 98–111)
Creatinine, Ser: 0.78 mg/dL (ref 0.44–1.00)
GFR, Estimated: >60 mL/min (ref >60)
Glucose, Bld: 104 mg/dL — ABNORMAL HIGH (ref 70–99)
Potassium: 3.4 mmol/L — ABNORMAL LOW (ref 3.5–5.1)
Sodium: 138 mmol/L (ref 135–145)

## 2020-07-26 MED ORDER — TRAMADOL HCL 50 MG PO TABS
50.0000 mg | ORAL_TABLET | Freq: Four times a day (QID) | ORAL | 0 refills | Status: DC | PRN
Start: 2020-07-26 — End: 2020-08-03

## 2020-07-26 MED ORDER — PANTOPRAZOLE SODIUM 40 MG PO TBEC: 40.0000 mg | DELAYED_RELEASE_TABLET | Freq: Every day | ORAL | 0 refills | Status: AC

## 2020-07-26 MED ORDER — INFLUENZA VAC SPLIT QUAD 0.5 ML IM SUSY
0.5000 mL | PREFILLED_SYRINGE | Freq: Once | INTRAMUSCULAR | Status: AC
  Administered 2020-07-26: 0.5 mL via INTRAMUSCULAR
  Filled 2020-07-26: qty 0.5

## 2020-07-26 MED ORDER — POTASSIUM CHLORIDE CRYS ER 20 MEQ PO TBCR
20.0000 meq | EXTENDED_RELEASE_TABLET | Freq: Two times a day (BID) | ORAL | 0 refills | Status: DC
Start: 2020-07-26 — End: 2021-04-11

## 2020-07-26 MED ORDER — BENZOCAINE 10 % MT GEL
Freq: Four times a day (QID) | OROMUCOSAL | 0 refills | Status: DC
Start: 2020-07-26 — End: 2020-08-03

## 2020-07-26 MED ORDER — FUROSEMIDE 40 MG PO TABS: 40.0000 mg | ORAL_TABLET | Freq: Every day | ORAL | 0 refills | Status: DC | PRN

## 2020-07-26 MED ORDER — PNEUMOCOCCAL 13-VAL CONJ VACC IM SUSP
0.5000 mL | Freq: Once | INTRAMUSCULAR | Status: AC
  Administered 2020-07-26: 0.5 mL via INTRAMUSCULAR
  Filled 2020-07-26: qty 0.5

## 2020-07-26 MED ORDER — NICOTINE 21 MG/24HR TD PT24
21.0000 mg | MEDICATED_PATCH | Freq: Every day | TRANSDERMAL | 0 refills | Status: DC
Start: 2020-07-27 — End: 2021-08-02

## 2020-07-26 MED ORDER — FUROSEMIDE 40 MG PO TABS: 40.0000 mg | ORAL_TABLET | Freq: Two times a day (BID) | ORAL | 0 refills | Status: DC

## 2020-07-26 MED ORDER — RIVAROXABAN 20 MG PO TABS
20.0000 mg | ORAL_TABLET | Freq: Every day | ORAL | 0 refills | Status: DC
Start: 2020-07-26 — End: 2020-12-13

## 2020-07-26 MED ORDER — METOPROLOL TARTRATE 50 MG PO TABS: 50.0000 mg | ORAL_TABLET | Freq: Two times a day (BID) | ORAL | 0 refills | Status: AC

## 2020-07-26 NOTE — TOC Progression Note (Signed)
Transition of Care [facility]) - Progression Note    Patient Details  Name: Brianna Erickson MRN: [id number] Date of Birth: 11-27-1965  Transition of Care [facility]) CM/SW Contact  Geni Bers, RN Phone Number: 07/26/2020, 12:16 PM  Clinical Narrative:    [facility] was selected for HHPT at discharge. [employer] was selected for home oxygen.   Expected Discharge Plan: Home/Self Care Barriers to Discharge: No Barriers Identified  Expected Discharge Plan and Services Expected Discharge Plan: Home/Self Care         Expected Discharge Date: 07/26/20                                     Social Determinants of Health (SDOH) Interventions    Readmission Risk Interventions No flowsheet data found.

## 2020-07-26 NOTE — Progress Notes (Signed)
Overnight Pulse Ox study complete.  Report to follow in pt's chart.

## 2020-07-26 NOTE — Progress Notes (Signed)
Pt discharged to home at this time. Prior to DC, pt IV and tele were removed. She was given all vaccines requested, f/u appointments and medications were reviewed. Pt verbalized understanding and stated no other concerns at this time.Pt stable at time of discharge in personal vehicle being driven by friend.

## 2020-07-27 ENCOUNTER — Telehealth: Payer: Self-pay

## 2020-07-27 NOTE — Telephone Encounter (Signed)
TCM call completed.Patient will be a new patient for Dr. Claiborne Billings. Hospital noted states for patient to have hospital follow up visit in 1 week but no slots available.

## 2020-07-27 NOTE — Telephone Encounter (Signed)
Transition Care Management Follow-up Telephone Call  Date of discharge and from where: 07/26/2020-[place]  How have you been since you were released from the hospital? Doing ok  Any questions or concerns? No  Items Reviewed:  Did the pt receive and understand the discharge instructions provided? Yes   Medications obtained and verified? Yes   Other? Yes   Any new allergies since your discharge? No   Dietary orders reviewed? Yes  Do you have support at home? Yes   Home Care and Equipment/Supplies: Were home health services ordered? yes If so, what is the name of the agency? [facility]  Has the agency set up a time to come to the patient's home? no Were any new equipment or medical supplies ordered?  Yes: oxygen What is the name of the medical supply agency? [employer] Were you able to get the supplies/equipment? yes Do you have any questions related to the use of the equipment or supplies? No  Functional Questionnaire: (I = Independent and D = Dependent) ADLs: I  Bathing/Dressing- I  Meal Prep- I  Eating- I  Maintaining continence- I  Transferring/Ambulation- I  Managing Meds- I  Follow up appointments reviewed:   PCP Hospital f/u appt confirmed? No-No slots available-Office administrator working on scheduling an appt.  Specialist Hospital f/u appt confirmed? No-Ptient awaiting call from cardiology  Are transportation arrangements needed? No   If their condition worsens, is the pt aware to call PCP or go to the Emergency Dept.? Yes  Was the patient provided with contact information for the PCP's office or ED? Yes  Was to pt encouraged to call back with questions or concerns? Yes

## 2020-07-28 ENCOUNTER — Ambulatory Visit: Payer: 59 | Admitting: Nurse Practitioner

## 2020-07-28 NOTE — Discharge Summary (Signed)
[facility] Discharge Summary   Patient: Brianna Erickson ZOX:[id number]  PCP: No primary care provider on file.  Date of admission: 07/20/2020   Date of discharge: 07/26/2020     Discharge Diagnoses:   Principal Problem:   Atrial flutter with rapid ventricular response (HCC) Active Problems:   Alcohol use   Depression with anxiety   Admitted From: home Disposition:  Home   Recommendations for Outpatient Follow-up:  1. PCP: please follow up with PCP Cardiology as recommended 2. Follow up LABS/TEST:  BMP   Follow-up Information    [facility]. Go on 08/03/2020.   Specialty: Cardiology Why: Rudi Coco, NP, 12/15 :30 am in the [facility] clinic Contact information: [address] [id number] [place] [address] [phone]       [facility] Follow up.   Specialty: Home Health Services Why: Will follow you at home for Home Health PT Contact information: [address] [place] [address] [phone]        [employer] Follow up.   Why: this company will supply your oxygen. Call the above number if you have any problems Contact information: [facility] [address] [place], [place] [address]       Jeoffrey Massed, MD. Schedule an appointment as soon as possible for a visit in 1 week(s).   Specialty: Family Medicine Why: BMP in 1 week Contact information: [address] [place] [address] [phone]              Diet recommendation: Cardiac diet  Activity: The patient is advised to gradually reintroduce usual activities, as tolerated  Discharge Condition: stable  Code Status: Limited code   History of present illness: As per the H and P dictated on admission, "Brianna Erickson is a 54 y.o. female with medical history significant for anxiety/depression, alcohol and tobacco use, obesity s/p gastric bypass surgery who presented to the ED for evaluation of  progressive shortness of breath and lower extremity swelling.  Patient reports over the last 2-3 months of having progressive lower extremity swelling which started on her feet and is now up to her abdomen.  She has had an associated 40 pound weight gain over the last 2 months.  She has been having increasing dyspnea on exertion and orthopnea.  She reports cough productive of clear sputum which has been occurring every morning.  She has had decreased appetite.  She has not had any chest pain or palpitations.  She denies any subjective fevers or diaphoresis.  She says she was recently started on Vraylar (second generation antipsychotic) for management of her depression around the time her symptoms began.  She says she is not routinely seeing a primary care physician.  She does see psychiatry for management of her depression/anxiety.  She has been on chronic alprazolam.  She reports tobacco use smoking a pack per day for at least 20 years.  She reports alcohol use, usually drinking a sixpack of beer on Wednesdays and Fridays/Saturdays.  She denies any history of alcohol withdrawal.  She reports occasional marijuana use.  She denies any cocaine or IV drug use.  "  Hospital Course:  Summary of her active problems in the hospital is as following.   1.  A. fib, a flutter with RVR [place] vas score 3 Treated with IV Cardizem infusion. Required almost 24 hours of 15 mg/h IV Cardizem. Transition to 360 mg Cardizem.  Patient unable to  tolerate the capsule. Tolerating Lopressor and rate remained controlled on Lopressor. Discussed options for anticoagulation. Prefers to be on daily medicine for now Xarelto. Outpatient follow-up with cardiology recommended.  2.  Acute on chronic diastolic CHF Echocardiogram shows preserved EF rather hyperdynamic circulation. Continue aggressive IV diuresis with IV Lasix. Monitor ins and outs. So far -16 L.  3.  Hypokalemia,  hypomagnesemia Replacing. Monitor.  4.  Depression, anxiety Continue home regimen.   Patient is on multiple psych medication.   Patient tells me that she is on all this medication as she kept on complaining that she is not feeling well to her psychiatrist. Her medication may need to be adjusted.  5.  Alcohol use disorder 3 times a week.  Drinks 6 cans of beer. So far no withdrawal.  6.  Active smoker. Nicotine patch for now.  7.  Morbid obesity. Gastric bypass history. At risk for sleep apnea. Need sleep apnea assessment  Outpatient follow-up with dietary. Body mass index is 54.62 kg/m.   8. GERD Add PPI  9.  Dental pain. Continue Orajel. Unable to use NSAIDs given anticoagulation Unable to tolerate norco, with hypotension Continue tramadol  10.  Nocturnal hypoxia. We will check nocturnal oximetry and monitor.  11.  Received Pfizer vaccine first dose this admission on 07/25/2020. Requesting pneumonia and flu shot as well.  12.  Patient will need time off from work so that she can maintain follow-up with providers.  Pain control  - [employer] Controlled Substance Reporting System database was reviewed. - 5 day supply was provided. - Patient was instructed, not to drive, operate heavy machinery, perform activities at heights, swimming or participation in water activities or provide baby sitting services while on Pain, Sleep and Anxiety Medications; until her outpatient Physician has advised to do so again.  - Also recommended to not to take more than prescribed Pain, Sleep and Anxiety Medications.  Patient was seen by physical therapy, who recommended no therapy needed on discharge,  On the day of the discharge the patient's vitals were stable, and no other acute medical condition were reported by patient. The patient was felt safe to be discharge at Home with no therapy needed on discharge.  Consultants: none Procedures: Echocardiogram   Discharge  Exam: General: Appear in no distress, no Rash; Oral Mucosa Clear, moist. no Abnormal Neck Mass Or lumps, Conjunctiva normal  Cardiovascular: S1 and S2 Present, no Murmur Respiratory: good respiratory effort, Bilateral Air entry present and CTA, no Crackles, no wheezes Abdomen: Bowel Sound present, Soft and no tenderness Extremities: bilateral  Pedal edema Neurology: alert and oriented to time, place, and person affect appropriate. no new focal deficit  Filed Weights   07/21/20 0600 07/22/20 0425 07/23/20 0603  Weight: (!) 185.3 kg (!) 179.4 kg (!) 177.6 kg   Vitals:   07/26/20 0543 07/26/20 1201  BP: (!) 93/58 104/70  Pulse: 68   Resp: 16   Temp: 98.5 F (36.9 C)   SpO2:      DISCHARGE MEDICATION: Allergies as of 07/26/2020   No Known Allergies     Medication List    TAKE these medications   ALPRAZolam 0.5 MG tablet Commonly known as: XANAX Take 0.5 mg by mouth 3 (three) times daily as needed for anxiety.   amitriptyline 25 MG tablet Commonly known as: ELAVIL Take 50 mg by mouth at bedtime.   benzocaine 10 % mucosal gel Commonly known as: ORAJEL Use as directed in the mouth or throat 4 (four)  times daily.   buPROPion 300 MG 24 hr tablet Commonly known as: WELLBUTRIN XL Take 300 mg by mouth daily.   escitalopram 20 MG tablet Commonly known as: LEXAPRO Take 20 mg by mouth daily.   furosemide 40 MG tablet Commonly known as: Lasix Take 1 tablet (40 mg total) by mouth 2 (two) times daily.   furosemide 40 MG tablet Commonly known as: Lasix Take 1 tablet (40 mg total) by mouth daily as needed for fluid or edema (weight gain of 3Lbs in 1 day or 5Lbs in 2 days).   metoprolol tartrate 50 MG tablet Commonly known as: LOPRESSOR Take 1 tablet (50 mg total) by mouth 2 (two) times daily.   nicotine 21 mg/24hr patch Commonly known as: NICODERM CQ - dosed in mg/24 hours Place 1 patch (21 mg total) onto the skin daily.   pantoprazole 40 MG tablet Commonly known as:  PROTONIX Take 1 tablet (40 mg total) by mouth daily.   potassium chloride SA 20 MEQ tablet Commonly known as: KLOR-CON Take 1 tablet (20 mEq total) by mouth 2 (two) times daily.   rivaroxaban 20 MG Tabs tablet Commonly known as: XARELTO Take 1 tablet (20 mg total) by mouth daily with supper.   traMADol 50 MG tablet Commonly known as: Ultram Take 1 tablet (50 mg total) by mouth every 6 (six) hours as needed.   Vraylar capsule Generic drug: cariprazine Take 1.5 mg by mouth daily.      No Known Allergies Discharge Instructions    Amb Referral to [facility] Clinic   Complete by: As directed    Ambulatory referral to Sleep Studies   Complete by: As directed    Diet - low sodium heart healthy   Complete by: As directed    Discharge instructions   Complete by: As directed    It is important that you read the given instructions as well as go over your medication list with RN to help you understand your care after this hospitalization.  Please follow-up with PCP in 1-2 weeks.  Please note that NO REFILLS for any discharge medications will be authorized once you are discharged, as it is imperative that you return to your primary care physician (or establish a relationship with a primary care physician if you do not have one) for your aftercare needs so that they can reassess your need for medications and monitor your lab values.  Please request your primary care physician to go over all Hospital Tests and Procedure/Radiological results at the follow up. Please get all Hospital records sent to your PCP by signing hospital release before you go home.   Do not drive, operating heavy machinery, perform activities at heights, swimming or participation in water activities or provide baby sitting services while you are on Pain, Sleep and Anxiety Medications; until you have been seen by Primary Care Physician or a Neurologist and are cleared to do such activities.  Do not take more than prescribed  Pain, Sleep and Anxiety Medications.  You were cared for by a hospitalist during your hospital stay. If you have any questions about your discharge medications or the care you received while you were in the hospital after you are discharged, you can call the unit @ you were admitted to and ask to speak with the hospitalist Lynden Oxford. Ask for Hospitalist on call if the hospitalist that took care of you is not available.   Once you are discharged, your primary care physician will handle any further medical  issues.  You Must read complete instructions/literature along with all the possible adverse reactions/side effects for all the Medicines you take and that have been prescribed to you. Take any new Medicines after you have completely understood and accept all the possible adverse reactions/side effects.  If you have smoked or chewed Tobacco in the last 2 yrs please STOP smoking STOP any Recreational drug use.  If you drink alcohol, please safely reduce the use. Do not drive, operating heavy machinery, perform activities at heights, swimming or participation in water activities or provide baby sitting services under influence.  Wear Seat belts while driving.   Increase activity slowly   Complete by: As directed       The results of significant diagnostics from this hospitalization (including imaging, microbiology, ancillary and laboratory) are listed below for reference.    Significant Diagnostic Studies: DG Chest 2 View  Result Date: 07/20/2020 CLINICAL DATA:  Chronic shortness of breath and vomiting. EXAM: CHEST - 2 VIEW COMPARISON:  Chest x-ray dated March 15, 2007. FINDINGS: New mild cardiomegaly with pulmonary vascular congestion and mild interstitial thickening in the mid to lower lungs. No focal consolidation, pleural effusion, or pneumothorax. No acute osseous abnormality. IMPRESSION: 1. Mild congestive heart failure. Electronically Signed   By: Obie Dredge M.D.   On:  07/20/2020 11:54   US Venous Img Lower Bilateral (DVT)  Result Date: 07/20/2020 CLINICAL DATA:  Bilateral leg swelling. EXAM: BILATERAL LOWER EXTREMITY VENOUS DOPPLER ULTRASOUND BILATERAL LOWER EXTREMITY VENOUS DOPPLER ULTRASOUND TECHNIQUE: Gray-scale sonography with graded compression, as well as color Doppler and duplex ultrasound were performed to evaluate the lower extremity deep venous systems from the level of the common femoral vein and including the common femoral, femoral, profunda femoral, popliteal and calf veins including the posterior tibial, peroneal and gastrocnemius veins when visible. The superficial great saphenous vein was also interrogated. Spectral Doppler was utilized to evaluate flow at rest and with distal augmentation maneuvers in the common femoral, femoral and popliteal veins. COMPARISON:  None. FINDINGS: Normal compressibility of the common femoral and superficial femoral veins bilaterally. Visualized portions of profunda femoral vein and great saphenous vein unremarkable bilaterally. The calf veins are not visualized bilaterally secondary to patient body habitus. No filling defects to suggest DVT on grayscale or color Doppler imaging. Doppler waveforms show normal direction of venous flow, normal respiratory plasticity and response to augmentation. Limitations: Evaluation is limited by patient body habitus. Calf veins are not visualized bilaterally. IMPRESSION: Limited evaluation secondary to patient body habitus with nonvisualization of the calf veins bilaterally. Within this limitation, no evidence of DVT. Electronically Signed   By: Feliberto Harts MD   On: 07/20/2020 13:35   ECHOCARDIOGRAM COMPLETE  Result Date: 07/21/2020    ECHOCARDIOGRAM REPORT   Patient Name:   Brianna Erickson Date of Exam: 07/21/2020 Medical Rec #:  [id number]        Height:       71.0 in Accession #:    [id number]       Weight:       408.5 lb Date of Birth:  12-Dec-1965        BSA:          2.856 m  Patient Age:    54 years         BP:           123/85 mmHg Patient Gender: F                HR:  119 bpm. Exam Location:  Inpatient Procedure: 2D Echo Indications:    Atrial Flutter I48.92  History:        Patient has no prior history of Echocardiogram examinations.                 Risk Factors:Current Smoker.  Sonographer:    Thurman Coyer RDCS (AE) Referring Phys: [id number] VISHAL R Ether Goebel IMPRESSIONS  1. Left ventricular ejection fraction, by estimation, is 70 to 75%. The left ventricle has hyperdynamic function. The left ventricle has no regional wall motion abnormalities. There is mild left ventricular hypertrophy. Left ventricular diastolic parameters are indeterminate.  2. Right ventricular systolic function is normal. The right ventricular size is normal. Tricuspid regurgitation signal is inadequate for assessing PA pressure.  3. A small pericardial effusion is present. The pericardial effusion is posterior to the left ventricle.  4. The mitral valve is normal in structure. Trivial mitral valve regurgitation. No evidence of mitral stenosis.  5. The aortic valve is tricuspid. Aortic valve regurgitation is not visualized. No aortic stenosis is present.  6. Aortic dilatation noted. There is mild dilatation of the ascending aorta, measuring 40 mm.  7. The inferior vena cava is dilated in size with <50% respiratory variability, suggesting right atrial pressure of 15 mmHg. FINDINGS  Left Ventricle: Left ventricular ejection fraction, by estimation, is 70 to 75%. The left ventricle has hyperdynamic function. The left ventricle has no regional wall motion abnormalities. The left ventricular internal cavity size was normal in size. There is mild left ventricular hypertrophy. Left ventricular diastolic parameters are indeterminate. Right Ventricle: The right ventricular size is normal. Right ventricular systolic function is normal. Tricuspid regurgitation signal is inadequate for assessing PA pressure. The  tricuspid regurgitant velocity is 2.13 m/s, and with an assumed right atrial  pressure of 8 mmHg, the estimated right ventricular systolic pressure is 26.1 mmHg. Left Atrium: Left atrial size was normal in size. Right Atrium: Right atrial size was normal in size. Pericardium: A small pericardial effusion is present. The pericardial effusion is posterior to the left ventricle. Mitral Valve: The mitral valve is normal in structure. Trivial mitral valve regurgitation. No evidence of mitral valve stenosis. Tricuspid Valve: The tricuspid valve is normal in structure. Tricuspid valve regurgitation is trivial. No evidence of tricuspid stenosis. Aortic Valve: The aortic valve is tricuspid. Aortic valve regurgitation is not visualized. No aortic stenosis is present. Pulmonic Valve: The pulmonic valve was not well visualized. Pulmonic valve regurgitation is not visualized. No evidence of pulmonic stenosis. Aorta: Aortic dilatation noted. There is mild dilatation of the ascending aorta, measuring 40 mm. Venous: The inferior vena cava is dilated in size with less than 50% respiratory variability, suggesting right atrial pressure of 15 mmHg.  LEFT VENTRICLE PLAX 2D LVIDd:         4.10 cm LVIDs:         2.30 cm LV PW:         1.20 cm LV IVS:        1.20 cm LVOT diam:     2.40 cm LVOT Area:     4.52 cm  LEFT ATRIUM              Index       RIGHT ATRIUM           Index LA diam:        4.60 cm  1.61 cm/m  RA Area:     21.50 cm LA Vol (A2C):   101.0  ml 35.36 ml/m RA Volume:   56.70 ml  19.85 ml/m LA Vol (A4C):   65.2 ml  22.83 ml/m LA Biplane Vol: 80.8 ml  28.29 ml/m   AORTA Ao Root diam: 3.40 cm TRICUSPID VALVE TR Peak grad:   18.1 mmHg TR Vmax:        213.00 cm/s  SHUNTS Systemic Diam: 2.40 cm Olga Millers MD Electronically signed by Olga Millers MD Signature Date/Time: 07/21/2020/2:48:14 PM    Final     Microbiology: Recent Results (from the past 240 hour(s))  Resp Panel by RT-PCR (Flu A&B, Covid) Nasopharyngeal  Swab     Status: None   Collection Time: 07/20/20  2:40 PM   Specimen: Nasopharyngeal Swab; Nasopharyngeal(NP) swabs in vial transport medium  Result Value Ref Range Status   SARS Coronavirus 2 by RT PCR NEGATIVE NEGATIVE Final    Comment: (NOTE) SARS-CoV-2 target nucleic acids are NOT DETECTED.  The SARS-CoV-2 RNA is generally detectable in upper respiratory specimens during the acute phase of infection. The lowest concentration of SARS-CoV-2 viral copies this assay can detect is 138 copies/mL. A negative result does not preclude SARS-Cov-2 infection and should not be used as the sole basis for treatment or other patient management decisions. A negative result may occur with  improper specimen collection/handling, submission of specimen other than nasopharyngeal swab, presence of viral mutation(s) within the areas targeted by this assay, and inadequate number of viral copies(<138 copies/mL). A negative result must be combined with clinical observations, patient history, and epidemiological information. The expected result is Negative.  Fact Sheet for Patients:  BloggerCourse.com  Fact Sheet for Healthcare Providers:  SeriousBroker.it  This test is no t yet approved or cleared by the [place] FDA and  has been authorized for detection and/or diagnosis of SARS-CoV-2 by FDA under an Emergency Use Authorization (EUA). This EUA will remain  in effect (meaning this test can be used) for the duration of the COVID-19 declaration under Section 564(b)(1) of the Act, 21 U.S.C.section 360bbb-3(b)(1), unless the authorization is terminated  or revoked sooner.       Influenza A by PCR NEGATIVE NEGATIVE Final   Influenza B by PCR NEGATIVE NEGATIVE Final    Comment: (NOTE) The Xpert Xpress SARS-CoV-2/FLU/RSV plus assay is intended as an aid in the diagnosis of influenza from Nasopharyngeal swab specimens and should not be used as a sole  basis for treatment. Nasal washings and aspirates are unacceptable for Xpert Xpress SARS-CoV-2/FLU/RSV testing.  Fact Sheet for Patients: BloggerCourse.com  Fact Sheet for Healthcare Providers: SeriousBroker.it  This test is not yet approved or cleared by the [place] FDA and has been authorized for detection and/or diagnosis of SARS-CoV-2 by FDA under an Emergency Use Authorization (EUA). This EUA will remain in effect (meaning this test can be used) for the duration of the COVID-19 declaration under Section 564(b)(1) of the Act, 21 U.S.C. section 360bbb-3(b)(1), unless the authorization is terminated or revoked.  Performed at [facility], [address] [place], [place] [address]      Labs: CBC: Recent Labs  Lab 07/21/20 [phone] 07/23/20 0553 07/24/20 0438 07/25/20 0446 07/26/20 0459  WBC 6.4 7.2 7.5 7.1 7.1  HGB 10.0* 10.7* 10.5* 11.0* 10.9*  HCT 33.0* 34.4* 34.2* 36.2 35.8*  MCV 89.7 89.4 89.1 88.7 88.6  PLT 204 218 241 249 255   Basic Metabolic Panel: Recent Labs  Lab 07/22/20 0440 07/22/20 0440 07/22/20 1719 07/22/20 1719 07/23/20 0553 07/23/20 1644 07/24/20 1122 07/25/20  [id number] 07/26/20 0459  NA 136   < > 136   < > 136 136 137 138 138  K 3.2*   < > 3.6   < > 3.4* 3.7 3.1* 3.2* 3.4*  CL 96*   < > 96*   < > 96* 97* 98 98 98  CO2 28   < > 30   < > 30 29 29 29 31   GLUCOSE 112*   < > 110*   < > 106* 103* 120* 104* 104*  BUN 6   < > 10   < > 9 13 13 14 20   CREATININE 0.71   < > 0.76   < > 0.65 0.67 0.68 0.66 0.78  CALCIUM 8.7*   < > 9.3   < > 8.8* 9.0 9.1 8.9 9.1  MG 2.1  --  2.0  --  2.0 2.1 2.0  --   --    < > = values in this interval not displayed.   Liver Function Tests: No results for input(s): AST, ALT, ALKPHOS, BILITOT, PROT, ALBUMIN in the last 168 hours. CBG: No results for input(s): GLUCAP in the last 168 hours.  Time spent: 35 minutes  Signed:  Lynden Oxfordranav Kamdyn Colborn  [facility] 07/26/2020

## 2020-08-03 ENCOUNTER — Other Ambulatory Visit: Payer: Self-pay

## 2020-08-03 ENCOUNTER — Encounter (HOSPITAL_COMMUNITY): Payer: Self-pay | Admitting: Nurse Practitioner

## 2020-08-03 ENCOUNTER — Ambulatory Visit (HOSPITAL_COMMUNITY)
Admit: 2020-08-03 | Discharge: 2020-08-03 | Disposition: A | Discharge status: Home / Self Care | Payer: 59 | Attending: Nurse Practitioner | Admitting: Nurse Practitioner

## 2020-08-03 VITALS — BP 106/76 | HR 109 | Ht 71.0 in | Wt 376.8 lb

## 2020-08-03 DIAGNOSIS — D6869 Other thrombophilia: Secondary | ICD-10-CM

## 2020-08-03 DIAGNOSIS — Z7901 Long term (current) use of anticoagulants: Secondary | ICD-10-CM | POA: Diagnosis not present

## 2020-08-03 DIAGNOSIS — I483 Typical atrial flutter: Secondary | ICD-10-CM | POA: Diagnosis present

## 2020-08-03 DIAGNOSIS — F17210 Nicotine dependence, cigarettes, uncomplicated: Secondary | ICD-10-CM | POA: Insufficient documentation

## 2020-08-03 DIAGNOSIS — Z9049 Acquired absence of other specified parts of digestive tract: Secondary | ICD-10-CM | POA: Insufficient documentation

## 2020-08-03 DIAGNOSIS — Z79899 Other long term (current) drug therapy: Secondary | ICD-10-CM | POA: Insufficient documentation

## 2020-08-03 DIAGNOSIS — Z6841 Body Mass Index (BMI) 40.0 and over, adult: Secondary | ICD-10-CM | POA: Insufficient documentation

## 2020-08-03 DIAGNOSIS — F419 Anxiety disorder, unspecified: Secondary | ICD-10-CM | POA: Diagnosis not present

## 2020-08-03 DIAGNOSIS — Z9884 Bariatric surgery status: Secondary | ICD-10-CM | POA: Diagnosis not present

## 2020-08-03 DIAGNOSIS — F32A Depression, unspecified: Secondary | ICD-10-CM | POA: Diagnosis not present

## 2020-08-03 DIAGNOSIS — I5033 Acute on chronic diastolic (congestive) heart failure: Secondary | ICD-10-CM | POA: Insufficient documentation

## 2020-08-03 NOTE — Progress Notes (Signed)
Primary Care Physician: No primary care provider on file. Referring Physician: Dr. Allena Katz,  [facility] f/u    Brianna Erickson is a 54 y.o. female with a h/o foranxiety/depression, alcohol and tobacco use, obesity s/p gastric bypass surgery who presented to the ED for evaluation of progressive shortness of breath and lower extremity swelling.  Patient reports over the last 2-3 months of having progressive lower extremity swelling which started on her feet and is now up to her abdomen. She has had an associated 40 pound weight gain over the last 2 months. She has been having increasing dyspnea on exertion and orthopnea.  She does not follow with a PCP, has been followed by Psychiatry.  Positive for tobacco use,  smoking a pack per day for at least 20 years. She reports alcohol use, usually drinking a sixpack of beer on Wednesdays and Fridays/Saturdays. She denies any history of alcohol withdrawal. She reports occasional marijuana use. She denies any cocaine or IV drug use.   She was found to be in new onset typical atrial flutter with RVR. Treated with IV cardoizem, pt waw unable to tolerate the po Cardizem capsule so was placed on metoprolol tartrate. She was placed on anticoagulation, xarelto 20 mg daily for a CHA2DS2VASc score of 1. She has been on drug now x 10 days.   Her ekg today shows typical atrial  flutter at 109 bpm. She has eliminated alcohol, has modified    her diet, has decreased smoking to 10 cigarettes a day. Sleep study is pending. Her fluid status is staying stable after significant diuresis in the hospital. She had been put out of work until mid January, but pt appears stable today to return to work next Monday. She is weighting daily and avoiding salt.   Today, she denies symptoms of palpitations, chest pain, shortness of breath, orthopnea, PND, lower extremity edema, dizziness, presyncope, syncope, or neurologic sequela. The patient is tolerating medications without  difficulties and is otherwise without complaint today.   Past Medical History:  Diagnosis Date  . Alcohol use   . Depression with anxiety    Past Surgical History:  Procedure Laterality Date  . CHOLECYSTECTOMY    . GASTRIC BYPASS      Current Outpatient Medications  Medication Sig Dispense Refill  . ALPRAZolam (XANAX) 0.5 MG tablet Take 0.5 mg by mouth 3 (three) times daily as needed for anxiety.     Marland Kitchen amitriptyline (ELAVIL) 25 MG tablet Take 50 mg by mouth at bedtime.    Marland Kitchen buPROPion (WELLBUTRIN XL) 300 MG 24 hr tablet Take 300 mg by mouth daily.    . Cyanocobalamin (VITAMIN B12 PO) Take by mouth. Chews one gummy by mouth daily    . escitalopram (LEXAPRO) 20 MG tablet Take 20 mg by mouth daily.    . furosemide (LASIX) 40 MG tablet Take 1 tablet (40 mg total) by mouth 2 (two) times daily. 60 tablet 0  . furosemide (LASIX) 40 MG tablet Take 1 tablet (40 mg total) by mouth daily as needed for fluid or edema (weight gain of 3Lbs in 1 day or 5Lbs in 2 days). 60 tablet 0  . metoprolol tartrate (LOPRESSOR) 50 MG tablet Take 1 tablet (50 mg total) by mouth 2 (two) times daily. 60 tablet 0  . Multiple Vitamin (MULTIVITAMIN ADULT PO) Take by mouth. Chews one gummy by mouth daily    . nicotine (NICODERM CQ - DOSED IN MG/24 HOURS) 21 mg/24hr patch Place 1 patch (21 mg total)  onto the skin daily. 28 patch 0  . pantoprazole (PROTONIX) 40 MG tablet Take 1 tablet (40 mg total) by mouth daily. 30 tablet 0  . potassium chloride SA (KLOR-CON) 20 MEQ tablet Take 1 tablet (20 mEq total) by mouth 2 (two) times daily. 30 tablet 0  . rivaroxaban (XARELTO) 20 MG TABS tablet Take 1 tablet (20 mg total) by mouth daily with supper. 30 tablet 0  . VRAYLAR capsule Take 1.5 mg by mouth daily. Taking 1/2 tablet by mouth daily     No current facility-administered medications for this encounter.    No Known Allergies  Social History   Socioeconomic History  . Marital status: Divorced    Spouse name: Not on file   . Number of children: Not on file  . Years of education: Not on file  . Highest education level: Not on file  Occupational History  . Not on file  Tobacco Use  . Smoking status: Current Every Day Smoker    Types: Cigarettes  . Smokeless tobacco: Former Clinical biochemist  . Vaping Use: Never used  Substance and Sexual Activity  . Alcohol use: Yes    Comment: ocassional  . Drug use: Yes    Types: Marijuana  . Sexual activity: Not Currently    Partners: Male  Other Topics Concern  . Not on file  Social History Narrative  . Not on file   Social Determinants of Health   Financial Resource Strain: Not on file  Food Insecurity: Not on file  Transportation Needs: Not on file  Physical Activity: Not on file  Stress: Not on file  Social Connections: Not on file  Intimate Partner Violence: Not on file    Family History  Problem Relation Age of Onset  . Heart disease Father     ROS- All systems are reviewed and negative except as per the HPI above  Physical Exam: Vitals:   08/03/20 0930  BP: 106/76  Pulse: (!) 109  Weight: (!) 170.9 kg  Height: 5\' 11"  (1.803 m)   Wt Readings from Last 3 Encounters:  08/03/20 (!) 170.9 kg  07/23/20 (!) 177.6 kg    Labs: Lab Results  Component Value Date   NA 138 07/26/2020   K 3.4 (L) 07/26/2020   CL 98 07/26/2020   CO2 31 07/26/2020   GLUCOSE 104 (H) 07/26/2020   BUN 20 07/26/2020   CREATININE 0.78 07/26/2020   CALCIUM 9.1 07/26/2020   MG 2.0 07/24/2020   Lab Results  Component Value Date   INR 1.2 07/20/2020   Lab Results  Component Value Date   CHOL 85 07/21/2020   HDL 23 (L) 07/21/2020   LDLCALC 49 07/21/2020   TRIG 64 07/21/2020     GEN- The patient is well appearing, alert and oriented x 3 today.   Head- normocephalic, atraumatic Eyes-  Sclera clear, conjunctiva pink Ears- hearing intact Oropharynx- clear Neck- supple, no JVP Lymph- no cervical lymphadenopathy Lungs- Clear to ausculation bilaterally,  normal work of breathing Heart- Regular rate and rhythm, no murmurs, rubs or gallops, PMI not laterally displaced GI- soft, NT, ND, + BS Extremities- no clubbing, cyanosis, or edema MS- no significant deformity or atrophy Skin- no rash or lesion Psych- euthymic mood, full affect Neuro- strength and sensation are intact  EKG-  Typical atrial flutter with variable AV block at 109 bpm, qrs int 84 ms, qtc 476 ms  Ekg's in Epic reviewed and all appear to show typical atrial flutter.  Echo-1. Left ventricular ejection fraction, by estimation, is 70 to 75%. The left ventricle has hyperdynamic function. The left ventricle has no regional wall motion abnormalities. There is mild left ventricular hypertrophy. Left ventricular diastolic parameters are indeterminate. 2. Right ventricular systolic function is normal. The right ventricular size is normal. Tricuspid regurgitation signal is inadequate for assessing PA pressure. 3. A small pericardial effusion is present. The pericardial effusion is posterior to the left ventricle. 4. The mitral valve is normal in structure. Trivial mitral valve regurgitation. No evidence of mitral stenosis. 5. The aortic valve is tricuspid. Aortic valve regurgitation is not visualized. No aortic stenosis is present. 6. Aortic dilatation noted. There is mild dilatation of the ascending aorta, measuring 40 mm. 7. The inferior vena cava is dilated in size with <50% respiratory variability, suggesting right atrial pressure of 15 mmHg.   Assessment and Plan:  1. Typical atrial flutter No afib seen by ekg's in EPIC Discussed with Dr. Lalla Brothers He recommended CV after appropriate time on anticoagulation and then see in f/u and place a 2 week Zio patch He ideally likes pt's to be less than 300 lbs if ablation is discussed but if no  afib and recurrent typical atrial flutter is seen on monitor,  he would consider her for  an ablation  Triggers discussed as well as general  information on afib/flutter  Avoid alcohol, no salt, minimize caffeine Diet modification for weight loss  Sleep study is pending  She may return to work Monday 08/08/20  2. CHA2DS2VASc score of 1 Continue  xarelto 20 mg daily Reminded not to miss any doses Has been on now x 10 days   3. Acute on chronic diastolic HF Fluid weight is stable Continue lasix   4. Morbid obesity/ gastric bypass history  Diet modification encouraged and  exercise when SR is restored   5. Alcohol sus States has stopped alcohol use   6. Tobacco abuse  Using nicotine patch Down to 6 cigarettes a day   7. Depression/anxiety Per Psych  F/u here in 2 weeks to plan for cardioversion  Lupita Leash C. Matthew Folks [facility] [address] [place], [place] [address] [phone]

## 2020-08-04 ENCOUNTER — Other Ambulatory Visit: Payer: Self-pay | Admitting: Family Medicine

## 2020-08-04 DIAGNOSIS — Z1231 Encounter for screening mammogram for malignant neoplasm of breast: Secondary | ICD-10-CM

## 2020-08-07 ENCOUNTER — Encounter (HOSPITAL_COMMUNITY): Payer: Self-pay

## 2020-08-11 ENCOUNTER — Encounter: Payer: Self-pay | Admitting: Nurse Practitioner

## 2020-08-17 ENCOUNTER — Ambulatory Visit (HOSPITAL_COMMUNITY): Payer: 59 | Admitting: Nurse Practitioner

## 2020-08-18 ENCOUNTER — Other Ambulatory Visit: Payer: Self-pay | Admitting: Family Medicine

## 2020-08-18 DIAGNOSIS — Z1231 Encounter for screening mammogram for malignant neoplasm of breast: Secondary | ICD-10-CM

## 2020-08-24 ENCOUNTER — Encounter (HOSPITAL_COMMUNITY): Payer: Self-pay | Admitting: Physician Assistant

## 2020-08-24 ENCOUNTER — Ambulatory Visit (HOSPITAL_COMMUNITY)
Admission: RE | Admit: 2020-08-24 | Discharge: 2020-08-24 | Disposition: A | Discharge status: Home / Self Care | Payer: 59 | Source: Ambulatory Visit | Attending: Physician Assistant | Admitting: Physician Assistant

## 2020-08-24 ENCOUNTER — Ambulatory Visit (HOSPITAL_COMMUNITY)
Admission: RE | Admit: 2020-08-24 | Discharge: 2020-08-24 | Disposition: A | Discharge status: Home / Self Care | Payer: 59 | Source: Ambulatory Visit | Attending: Nurse Practitioner | Admitting: Nurse Practitioner

## 2020-08-24 ENCOUNTER — Other Ambulatory Visit: Payer: Self-pay

## 2020-08-24 VITALS — BP 108/80 | HR 112 | Ht 71.0 in | Wt 367.2 lb

## 2020-08-24 DIAGNOSIS — I483 Typical atrial flutter: Secondary | ICD-10-CM | POA: Diagnosis present

## 2020-08-24 DIAGNOSIS — Z6841 Body Mass Index (BMI) 40.0 and over, adult: Secondary | ICD-10-CM | POA: Diagnosis not present

## 2020-08-24 DIAGNOSIS — Z9884 Bariatric surgery status: Secondary | ICD-10-CM | POA: Insufficient documentation

## 2020-08-24 DIAGNOSIS — F32A Depression, unspecified: Secondary | ICD-10-CM | POA: Diagnosis not present

## 2020-08-24 DIAGNOSIS — F419 Anxiety disorder, unspecified: Secondary | ICD-10-CM | POA: Insufficient documentation

## 2020-08-24 DIAGNOSIS — I5033 Acute on chronic diastolic (congestive) heart failure: Secondary | ICD-10-CM | POA: Diagnosis not present

## 2020-08-24 DIAGNOSIS — D6869 Other thrombophilia: Secondary | ICD-10-CM | POA: Insufficient documentation

## 2020-08-24 DIAGNOSIS — I4892 Unspecified atrial flutter: Secondary | ICD-10-CM | POA: Diagnosis not present

## 2020-08-24 DIAGNOSIS — Z7901 Long term (current) use of anticoagulants: Secondary | ICD-10-CM | POA: Insufficient documentation

## 2020-08-24 DIAGNOSIS — E669 Obesity, unspecified: Secondary | ICD-10-CM | POA: Insufficient documentation

## 2020-08-24 DIAGNOSIS — Z79899 Other long term (current) drug therapy: Secondary | ICD-10-CM | POA: Diagnosis not present

## 2020-08-24 DIAGNOSIS — R0602 Shortness of breath: Secondary | ICD-10-CM | POA: Diagnosis not present

## 2020-08-24 DIAGNOSIS — F17210 Nicotine dependence, cigarettes, uncomplicated: Secondary | ICD-10-CM | POA: Insufficient documentation

## 2020-08-24 LAB — BASIC METABOLIC PANEL
Anion gap: 12 (ref 5–15)
BUN: 7 mg/dL (ref 6–20)
CO2: 26 mmol/L (ref 22–32)
Calcium: 9.6 mg/dL (ref 8.9–10.3)
Chloride: 99 mmol/L (ref 98–111)
Creatinine, Ser: 0.76 mg/dL (ref 0.44–1.00)
GFR, Estimated: >60 mL/min (ref >60)
Glucose, Bld: 120 mg/dL — ABNORMAL HIGH (ref 70–99)
Potassium: 3.9 mmol/L (ref 3.5–5.1)
Sodium: 137 mmol/L (ref 135–145)

## 2020-08-24 LAB — CBC
HCT: 40.5 % (ref 36.0–46.0)
Hemoglobin: 12.2 g/dL (ref 12.0–15.0)
MCH: 26.5 pg (ref 26.0–34.0)
MCHC: 30.1 g/dL (ref 30.0–36.0)
MCV: 88 fL (ref 80.0–100.0)
Platelets: 354 10*3/uL (ref 150–400)
RBC: 4.6 MIL/uL (ref 3.87–5.11)
RDW: 20 % — ABNORMAL HIGH (ref 11.5–15.5)
WBC: 12 10*3/uL — ABNORMAL HIGH (ref 4.0–10.5)
nRBC: 0 % (ref 0.0–0.2)

## 2020-08-24 NOTE — H&P (View-Only) (Signed)
Primary Care Physician: No primary care provider on file. Referring Physician: Dr. Allena Katz,  [facility] f/u    Brianna Erickson is a 55 y.o. female with a h/o foranxiety/depression, alcohol and tobacco use, obesity s/p gastric bypass surgery who presented to the ED for evaluation of progressive shortness of breath and lower extremity swelling.  Patient reports over the last 2-3 months of having progressive lower extremity swelling which started on her feet and is now up to her abdomen. She has had an associated 40 pound weight gain over the last 2 months. She has been having increasing dyspnea on exertion and orthopnea.  She does not follow with a PCP, has been followed by Psychiatry.  Positive for tobacco use,  smoking a pack per day for at least 20 years. She reports alcohol use, usually drinking a sixpack of beer on Wednesdays and Fridays/Saturdays. She denies any history of alcohol withdrawal. She reports occasional marijuana use. She denies any cocaine or IV drug use.   She was found to be in new onset typical atrial flutter with RVR. Treated with IV cardoizem, pt waw unable to tolerate the po Cardizem capsule so was placed on metoprolol tartrate. She was placed on anticoagulation, xarelto 20 mg daily for a CHA2DS2VASc score of 1. She has been on drug now x 10 days.   Her ekg today shows typical atrial  flutter at 109 bpm. She has eliminated alcohol, has modified her diet, has decreased smoking to 10 cigarettes a day. Sleep study is pending. Her fluid status is staying stable after significant diuresis in the hospital. She had been put out of work until mid January, but pt appears stable today to return to work next Monday. She is weighting daily and avoiding salt.   Follow up in the AF clinic 08/25/19. Patient remains in typical appearing atrial flutter with symptoms of fatigue. She denies any missed doses of anticoagulation. Her fluid status has improved.   Today, she denies symptoms of  palpitations, chest pain, shortness of breath, orthopnea, PND, lower extremity edema, dizziness, presyncope, syncope, or neurologic sequela. The patient is tolerating medications without difficulties and is otherwise without complaint today.   Past Medical History:  Diagnosis Date  . Alcohol use   . Depression with anxiety    Past Surgical History:  Procedure Laterality Date  . CHOLECYSTECTOMY    . GASTRIC BYPASS      Current Outpatient Medications  Medication Sig Dispense Refill  . albuterol (VENTOLIN HFA) 108 (90 Base) MCG/ACT inhaler     . ALPRAZolam (XANAX) 0.5 MG tablet Take 0.5 mg by mouth 3 (three) times daily as needed for anxiety.     Marland Kitchen amitriptyline (ELAVIL) 25 MG tablet Take 50 mg by mouth at bedtime.    Marland Kitchen buPROPion (WELLBUTRIN XL) 300 MG 24 hr tablet Take 300 mg by mouth daily.    . Cyanocobalamin (VITAMIN B12 PO) Take by mouth. Chews one gummy by mouth daily    . escitalopram (LEXAPRO) 20 MG tablet Take 20 mg by mouth daily.    . furosemide (LASIX) 40 MG tablet Take 1 tablet (40 mg total) by mouth daily as needed for fluid or edema (weight gain of 3Lbs in 1 day or 5Lbs in 2 days). 60 tablet 0  . metoprolol tartrate (LOPRESSOR) 50 MG tablet Take 1 tablet (50 mg total) by mouth 2 (two) times daily. 60 tablet 0  . Multiple Vitamin (MULTIVITAMIN ADULT PO) Take by mouth. Chews one gummy by mouth daily    .  nicotine (NICODERM CQ - DOSED IN MG/24 HOURS) 21 mg/24hr patch Place 1 patch (21 mg total) onto the skin daily. 28 patch 0  . pantoprazole (PROTONIX) 40 MG tablet Take 1 tablet (40 mg total) by mouth daily. 30 tablet 0  . potassium chloride SA (KLOR-CON) 20 MEQ tablet Take 1 tablet (20 mEq total) by mouth 2 (two) times daily. 30 tablet 0  . rivaroxaban (XARELTO) 20 MG TABS tablet Take 1 tablet (20 mg total) by mouth daily with supper. 30 tablet 0  . Vitamin D, Ergocalciferol, (DRISDOL) 1.25 MG (50000 UNIT) CAPS capsule Take 50,000 Units by mouth once a week.    Marland Kitchen VRAYLAR  capsule Take 1.5 mg by mouth daily. Taking 1/2 tablet by mouth daily     No current facility-administered medications for this encounter.    No Known Allergies  Social History   Socioeconomic History  . Marital status: Divorced    Spouse name: Not on file  . Number of children: Not on file  . Years of education: Not on file  . Highest education level: Not on file  Occupational History  . Not on file  Tobacco Use  . Smoking status: Current Every Day Smoker    Types: Cigarettes  . Smokeless tobacco: Former Systems developer  . Tobacco comment: 10 cigarettes daily  Vaping Use  . Vaping Use: Never used  Substance and Sexual Activity  . Alcohol use: Yes    Alcohol/week: 6.0 standard drinks    Types: 6 Cans of beer per week    Comment: ocassional  . Drug use: Not Currently    Types: Marijuana  . Sexual activity: Not Currently    Partners: Male  Other Topics Concern  . Not on file  Social History Narrative  . Not on file   Social Determinants of Health   Financial Resource Strain: Not on file  Food Insecurity: Not on file  Transportation Needs: Not on file  Physical Activity: Not on file  Stress: Not on file  Social Connections: Not on file  Intimate Partner Violence: Not on file    Family History  Problem Relation Age of Onset  . Heart disease Father     ROS- All systems are reviewed and negative except as per the HPI above  Physical Exam: Vitals:   08/24/20 1119  BP: 108/80  Pulse: (!) 112  Weight: (!) 166.6 kg  Height: 5\' 11"  (1.803 m)   Wt Readings from Last 3 Encounters:  08/24/20 (!) 166.6 kg  08/03/20 (!) 170.9 kg  07/23/20 (!) 177.6 kg    Labs: Lab Results  Component Value Date   NA 138 07/26/2020   K 3.4 (L) 07/26/2020   CL 98 07/26/2020   CO2 31 07/26/2020   GLUCOSE 104 (H) 07/26/2020   BUN 20 07/26/2020   CREATININE 0.78 07/26/2020   CALCIUM 9.1 07/26/2020   MG 2.0 07/24/2020   Lab Results  Component Value Date   INR 1.2 07/20/2020   Lab  Results  Component Value Date   CHOL 85 07/21/2020   HDL 23 (L) 07/21/2020   LDLCALC 49 07/21/2020   TRIG 64 07/21/2020    GEN- The patient is well appearing obese female, alert and oriented x 3 today.   HEENT-head normocephalic, atraumatic, sclera clear, conjunctiva pink, hearing intact, trachea midline. Lungs- Clear to ausculation bilaterally, normal work of breathing Heart- irregular rate and rhythm, no murmurs, rubs or gallops  GI- soft, NT, ND, + BS Extremities- no clubbing, cyanosis. Nonpitting  edema MS- no significant deformity or atrophy Skin- no rash or lesion Psych- euthymic mood, full affect Neuro- strength and sensation are intact   EKG-  Typical atrial flutter HR 112, QRS 74, QTc 234  Ekg's in Epic reviewed and all appear to show typical atrial flutter.    Echo-1. Left ventricular ejection fraction, by estimation, is 70 to 75%. The left ventricle has hyperdynamic function. The left ventricle has no regional wall motion abnormalities. There is mild left ventricular hypertrophy. Left ventricular diastolic parameters are indeterminate. 2. Right ventricular systolic function is normal. The right ventricular size is normal. Tricuspid regurgitation signal is inadequate for assessing PA pressure. 3. A small pericardial effusion is present. The pericardial effusion is posterior to the left ventricle. 4. The mitral valve is normal in structure. Trivial mitral valve regurgitation. No evidence of mitral stenosis. 5. The aortic valve is tricuspid. Aortic valve regurgitation is not visualized. No aortic stenosis is present. 6. Aortic dilatation noted. There is mild dilatation of the ascending aorta, measuring 40 mm. 7. The inferior vena cava is dilated in size with <50% respiratory variability, suggesting right atrial pressure of 15 mmHg.   Assessment and Plan:  1. Typical atrial flutter Will plan for DCCV now that she has been on uninterrupted anticoagulation for > 3  weeks.  Per Dr Lalla Brothers, will place a 2 week Zio patch post DCCV to screen for afib. If no afib and recurrent typical atrial flutter is seen on monitor, he would consider her for an ablation. Continue Xarelto 20 mg daily Continue Lopressor 50 mg BID  2. CHA2DS2VASc score of 1 Continue xarelto 20 mg daily  3. Acute on chronic diastolic HF No signs or symptoms of fluid overload today. Weight is down since last visit.  4. Obesity Body mass index is 51.21 kg/m. S/p gastric bypass Lifestyle modification was discussed and encouraged including regular physical activity and weight reduction. Likely significantly contributing to her arrhythmias.   Follow up in the AF clinic one week post DCCV.    Jorja Loa PA-C [facility] [address] [place], [place] [address] [phone]

## 2020-08-24 NOTE — Patient Instructions (Signed)
Cardioversion scheduled for Wednesday, January 12th  - Arrive at the [facility] and go to admitting at 1030AM  - Do not eat or drink anything after midnight the night prior to your procedure.  - Take all your morning medication (except diabetic medications) with a sip of water prior to arrival.  - You will not be able to drive home after your procedure.  - Do NOT miss any doses of your blood thinner - if you should miss a dose please notify our office immediately.  - If you feel as if you go back into normal rhythm prior to scheduled cardioversion, please notify our office immediately. If your procedure is canceled in the cardioversion suite you will be charged a cancellation fee.  Heart monitor will be mailed to your home to place on January 13th -- you will wear this for 14 days then mail in (January 27th).  We will call you once we receive the results of the monitor.

## 2020-08-24 NOTE — Progress Notes (Signed)
Primary Care Physician: No primary care provider on file. Referring Physician: Dr. Allena Katz,  [facility] f/u    Brianna Erickson is a 55 y.o. female with a h/o foranxiety/depression, alcohol and tobacco use, obesity s/p gastric bypass surgery who presented to the ED for evaluation of progressive shortness of breath and lower extremity swelling.  Patient reports over the last 2-3 months of having progressive lower extremity swelling which started on her feet and is now up to her abdomen. She has had an associated 40 pound weight gain over the last 2 months. She has been having increasing dyspnea on exertion and orthopnea.  She does not follow with a PCP, has been followed by Psychiatry.  Positive for tobacco use,  smoking a pack per day for at least 20 years. She reports alcohol use, usually drinking a sixpack of beer on Wednesdays and Fridays/Saturdays. She denies any history of alcohol withdrawal. She reports occasional marijuana use. She denies any cocaine or IV drug use.   She was found to be in new onset typical atrial flutter with RVR. Treated with IV cardoizem, pt waw unable to tolerate the po Cardizem capsule so was placed on metoprolol tartrate. She was placed on anticoagulation, xarelto 20 mg daily for a CHA2DS2VASc score of 1. She has been on drug now x 10 days.   Her ekg today shows typical atrial  flutter at 109 bpm. She has eliminated alcohol, has modified her diet, has decreased smoking to 10 cigarettes a day. Sleep study is pending. Her fluid status is staying stable after significant diuresis in the hospital. She had been put out of work until mid January, but pt appears stable today to return to work next Monday. She is weighting daily and avoiding salt.   Follow up in the AF clinic 08/25/19. Patient remains in typical appearing atrial flutter with symptoms of fatigue. She denies any missed doses of anticoagulation. Her fluid status has improved.   Today, she denies symptoms of  palpitations, chest pain, shortness of breath, orthopnea, PND, lower extremity edema, dizziness, presyncope, syncope, or neurologic sequela. The patient is tolerating medications without difficulties and is otherwise without complaint today.   Past Medical History:  Diagnosis Date  . Alcohol use   . Depression with anxiety    Past Surgical History:  Procedure Laterality Date  . CHOLECYSTECTOMY    . GASTRIC BYPASS      Current Outpatient Medications  Medication Sig Dispense Refill  . albuterol (VENTOLIN HFA) 108 (90 Base) MCG/ACT inhaler     . ALPRAZolam (XANAX) 0.5 MG tablet Take 0.5 mg by mouth 3 (three) times daily as needed for anxiety.     Marland Kitchen amitriptyline (ELAVIL) 25 MG tablet Take 50 mg by mouth at bedtime.    Marland Kitchen buPROPion (WELLBUTRIN XL) 300 MG 24 hr tablet Take 300 mg by mouth daily.    . Cyanocobalamin (VITAMIN B12 PO) Take by mouth. Chews one gummy by mouth daily    . escitalopram (LEXAPRO) 20 MG tablet Take 20 mg by mouth daily.    . furosemide (LASIX) 40 MG tablet Take 1 tablet (40 mg total) by mouth daily as needed for fluid or edema (weight gain of 3Lbs in 1 day or 5Lbs in 2 days). 60 tablet 0  . metoprolol tartrate (LOPRESSOR) 50 MG tablet Take 1 tablet (50 mg total) by mouth 2 (two) times daily. 60 tablet 0  . Multiple Vitamin (MULTIVITAMIN ADULT PO) Take by mouth. Chews one gummy by mouth daily    .  nicotine (NICODERM CQ - DOSED IN MG/24 HOURS) 21 mg/24hr patch Place 1 patch (21 mg total) onto the skin daily. 28 patch 0  . pantoprazole (PROTONIX) 40 MG tablet Take 1 tablet (40 mg total) by mouth daily. 30 tablet 0  . potassium chloride SA (KLOR-CON) 20 MEQ tablet Take 1 tablet (20 mEq total) by mouth 2 (two) times daily. 30 tablet 0  . rivaroxaban (XARELTO) 20 MG TABS tablet Take 1 tablet (20 mg total) by mouth daily with supper. 30 tablet 0  . Vitamin D, Ergocalciferol, (DRISDOL) 1.25 MG (50000 UNIT) CAPS capsule Take 50,000 Units by mouth once a week.    . VRAYLAR  capsule Take 1.5 mg by mouth daily. Taking 1/2 tablet by mouth daily     No current facility-administered medications for this encounter.    No Known Allergies  Social History   Socioeconomic History  . Marital status: Divorced    Spouse name: Not on file  . Number of children: Not on file  . Years of education: Not on file  . Highest education level: Not on file  Occupational History  . Not on file  Tobacco Use  . Smoking status: Current Every Day Smoker    Types: Cigarettes  . Smokeless tobacco: Former User  . Tobacco comment: 10 cigarettes daily  Vaping Use  . Vaping Use: Never used  Substance and Sexual Activity  . Alcohol use: Yes    Alcohol/week: 6.0 standard drinks    Types: 6 Cans of beer per week    Comment: ocassional  . Drug use: Not Currently    Types: Marijuana  . Sexual activity: Not Currently    Partners: Male  Other Topics Concern  . Not on file  Social History Narrative  . Not on file   Social Determinants of Health   Financial Resource Strain: Not on file  Food Insecurity: Not on file  Transportation Needs: Not on file  Physical Activity: Not on file  Stress: Not on file  Social Connections: Not on file  Intimate Partner Violence: Not on file    Family History  Problem Relation Age of Onset  . Heart disease Father     ROS- All systems are reviewed and negative except as per the HPI above  Physical Exam: Vitals:   08/24/20 1119  BP: 108/80  Pulse: (!) 112  Weight: (!) 166.6 kg  Height: 5' 11" (1.803 m)   Wt Readings from Last 3 Encounters:  08/24/20 (!) 166.6 kg  08/03/20 (!) 170.9 kg  07/23/20 (!) 177.6 kg    Labs: Lab Results  Component Value Date   NA 138 07/26/2020   K 3.4 (L) 07/26/2020   CL 98 07/26/2020   CO2 31 07/26/2020   GLUCOSE 104 (H) 07/26/2020   BUN 20 07/26/2020   CREATININE 0.78 07/26/2020   CALCIUM 9.1 07/26/2020   MG 2.0 07/24/2020   Lab Results  Component Value Date   INR 1.2 07/20/2020   Lab  Results  Component Value Date   CHOL 85 07/21/2020   HDL 23 (L) 07/21/2020   LDLCALC 49 07/21/2020   TRIG 64 07/21/2020    GEN- The patient is well appearing obese female, alert and oriented x 3 today.   HEENT-head normocephalic, atraumatic, sclera clear, conjunctiva pink, hearing intact, trachea midline. Lungs- Clear to ausculation bilaterally, normal work of breathing Heart- irregular rate and rhythm, no murmurs, rubs or gallops  GI- soft, NT, ND, + BS Extremities- no clubbing, cyanosis. Nonpitting   edema MS- no significant deformity or atrophy Skin- no rash or lesion Psych- euthymic mood, full affect Neuro- strength and sensation are intact   EKG-  Typical atrial flutter HR 112, QRS 74, QTc 234  Ekg's in Epic reviewed and all appear to show typical atrial flutter.    Echo-1. Left ventricular ejection fraction, by estimation, is 70 to 75%. The left ventricle has hyperdynamic function. The left ventricle has no regional wall motion abnormalities. There is mild left ventricular hypertrophy. Left ventricular diastolic parameters are indeterminate. 2. Right ventricular systolic function is normal. The right ventricular size is normal. Tricuspid regurgitation signal is inadequate for assessing PA pressure. 3. A small pericardial effusion is present. The pericardial effusion is posterior to the left ventricle. 4. The mitral valve is normal in structure. Trivial mitral valve regurgitation. No evidence of mitral stenosis. 5. The aortic valve is tricuspid. Aortic valve regurgitation is not visualized. No aortic stenosis is present. 6. Aortic dilatation noted. There is mild dilatation of the ascending aorta, measuring 40 mm. 7. The inferior vena cava is dilated in size with <50% respiratory variability, suggesting right atrial pressure of 15 mmHg.   Assessment and Plan:  1. Typical atrial flutter Will plan for DCCV now that she has been on uninterrupted anticoagulation for > 3  weeks.  Per Dr Lalla Brothers, will place a 2 week Zio patch post DCCV to screen for afib. If no afib and recurrent typical atrial flutter is seen on monitor, he would consider her for an ablation. Continue Xarelto 20 mg daily Continue Lopressor 50 mg BID  2. CHA2DS2VASc score of 1 Continue xarelto 20 mg daily  3. Acute on chronic diastolic HF No signs or symptoms of fluid overload today. Weight is down since last visit.  4. Obesity Body mass index is 51.21 kg/m. S/p gastric bypass Lifestyle modification was discussed and encouraged including regular physical activity and weight reduction. Likely significantly contributing to her arrhythmias.   Follow up in the AF clinic one week post DCCV.    Jorja Loa PA-C [facility] [address] [place], [place] [address] [phone]

## 2020-08-29 ENCOUNTER — Telehealth (HOSPITAL_COMMUNITY): Payer: Self-pay | Admitting: *Deleted

## 2020-08-29 ENCOUNTER — Other Ambulatory Visit (HOSPITAL_COMMUNITY): Payer: Self-pay | Admitting: *Deleted

## 2020-08-29 ENCOUNTER — Other Ambulatory Visit (HOSPITAL_COMMUNITY): Payer: 59

## 2020-08-29 NOTE — Telephone Encounter (Signed)
Patient called in stating she has not felt well over the weekend currently scheduled for cardioversion 1/12. Denies fever but has thick colored sputum with cough - instructed to contact PCP office for assessment.   Pt states she has URI infection requiring antibiotics - will postpone cardioversion until treatment complete. Pt in agreement with plan.

## 2020-08-30 ENCOUNTER — Other Ambulatory Visit (HOSPITAL_COMMUNITY): Payer: 59

## 2020-08-31 ENCOUNTER — Telehealth: Payer: Self-pay

## 2020-08-31 NOTE — Telephone Encounter (Signed)
error 

## 2020-09-01 ENCOUNTER — Institutional Professional Consult (permissible substitution): Payer: 59 | Admitting: Neurology

## 2020-09-01 NOTE — Telephone Encounter (Signed)
[facility] orders rec'd for pt.   Called [facility] and LVM with Brianna Erickson that pt has not ever been seen in our office.   Pt was once sched for a NP but cancelled.  Note: During reschedule pt was in the [facility] and had to cancel appt.

## 2020-09-02 ENCOUNTER — Ambulatory Visit (HOSPITAL_COMMUNITY): Payer: 59 | Admitting: Physician Assistant

## 2020-09-06 ENCOUNTER — Other Ambulatory Visit (HOSPITAL_COMMUNITY)
Admission: RE | Admit: 2020-09-06 | Discharge: 2020-09-06 | Disposition: A | Discharge status: Home / Self Care | Payer: 59 | Source: Ambulatory Visit | Attending: Cardiology | Admitting: Cardiology

## 2020-09-06 DIAGNOSIS — Z01818 Encounter for other preprocedural examination: Secondary | ICD-10-CM | POA: Diagnosis present

## 2020-09-06 DIAGNOSIS — Z20822 Contact with and (suspected) exposure to covid-19: Secondary | ICD-10-CM | POA: Diagnosis not present

## 2020-09-06 LAB — SARS CORONAVIRUS 2 (TAT 6-24 HRS): SARS Coronavirus 2: NEGATIVE

## 2020-09-07 NOTE — Progress Notes (Signed)
Pre call done for cardioversion tomorrow. Patient states she has been quarantined since covid test, has not missed any doses of her blood thinner and will take tonight, will be NPO in morning,and confirmed has a ride home post procedure. All questions addressed.

## 2020-09-08 ENCOUNTER — Other Ambulatory Visit: Payer: Self-pay

## 2020-09-08 ENCOUNTER — Ambulatory Visit (HOSPITAL_COMMUNITY): Payer: 59 | Admitting: Physician Assistant

## 2020-09-08 ENCOUNTER — Ambulatory Visit (HOSPITAL_COMMUNITY): Payer: 59 | Admitting: Anesthesiology

## 2020-09-08 ENCOUNTER — Encounter (HOSPITAL_COMMUNITY): Payer: Self-pay | Admitting: Cardiology

## 2020-09-08 ENCOUNTER — Encounter (HOSPITAL_COMMUNITY)
Admission: RE | Disposition: A | Discharge status: Home / Self Care | Payer: Self-pay | Source: Home / Self Care | Attending: Cardiology

## 2020-09-08 ENCOUNTER — Ambulatory Visit (HOSPITAL_COMMUNITY)
Admission: RE | Admit: 2020-09-08 | Discharge: 2020-09-08 | Disposition: A | Discharge status: Home / Self Care | Payer: 59 | Attending: Cardiology | Admitting: Cardiology

## 2020-09-08 DIAGNOSIS — I4892 Unspecified atrial flutter: Secondary | ICD-10-CM | POA: Diagnosis not present

## 2020-09-08 DIAGNOSIS — Z79899 Other long term (current) drug therapy: Secondary | ICD-10-CM | POA: Diagnosis not present

## 2020-09-08 DIAGNOSIS — Z7901 Long term (current) use of anticoagulants: Secondary | ICD-10-CM | POA: Diagnosis not present

## 2020-09-08 DIAGNOSIS — Z6841 Body Mass Index (BMI) 40.0 and over, adult: Secondary | ICD-10-CM | POA: Insufficient documentation

## 2020-09-08 DIAGNOSIS — I5033 Acute on chronic diastolic (congestive) heart failure: Secondary | ICD-10-CM | POA: Insufficient documentation

## 2020-09-08 DIAGNOSIS — E669 Obesity, unspecified: Secondary | ICD-10-CM | POA: Diagnosis not present

## 2020-09-08 DIAGNOSIS — I483 Typical atrial flutter: Secondary | ICD-10-CM | POA: Diagnosis not present

## 2020-09-08 DIAGNOSIS — F17210 Nicotine dependence, cigarettes, uncomplicated: Secondary | ICD-10-CM | POA: Insufficient documentation

## 2020-09-08 HISTORY — PX: CARDIOVERSION: SHX1299

## 2020-09-08 LAB — POCT I-STAT, CHEM 8
BUN: 11 mg/dL (ref 6–20)
Calcium, Ion: 1.16 mmol/L (ref 1.15–1.40)
Chloride: 102 mmol/L (ref 98–111)
Creatinine, Ser: 0.7 mg/dL (ref 0.44–1.00)
Glucose, Bld: 98 mg/dL (ref 70–99)
HCT: 41 % (ref 36.0–46.0)
Hemoglobin: 13.9 g/dL (ref 12.0–15.0)
Potassium: 3.9 mmol/L (ref 3.5–5.1)
Sodium: 140 mmol/L (ref 135–145)
TCO2: 30 mmol/L (ref 22–32)

## 2020-09-08 SURGERY — CARDIOVERSION
Anesthesia: General

## 2020-09-08 MED ORDER — LIDOCAINE HCL (CARDIAC) PF 100 MG/5ML IV SOSY
PREFILLED_SYRINGE | INTRAVENOUS | Status: DC | PRN
  Administered 2020-09-08: 60 mg via INTRATRACHEAL

## 2020-09-08 MED ORDER — SODIUM CHLORIDE 0.9 % IV SOLN: INTRAVENOUS | Status: DC | PRN

## 2020-09-08 MED ORDER — PROPOFOL 10 MG/ML IV BOLUS
INTRAVENOUS | Status: DC | PRN
  Administered 2020-09-08: 100 mg via INTRAVENOUS

## 2020-09-08 NOTE — Anesthesia Procedure Notes (Signed)
Procedure Name: General with mask airway Date/Time: 09/08/2020 10:00 AM Performed by: Lannie Fields, DO Pre-anesthesia Checklist: Patient identified, Emergency Drugs available, Suction available and Patient being monitored Patient Re-evaluated:Patient Re-evaluated prior to induction Oxygen Delivery Method: Ambu bag Preoxygenation: Pre-oxygenation with 100% oxygen Induction Type: IV induction Placement Confirmation: positive ETCO2 Dental Injury: Teeth and Oropharynx as per pre-operative assessment

## 2020-09-08 NOTE — Discharge Instructions (Signed)
Electrical Cardioversion Electrical cardioversion is the delivery of a jolt of electricity to restore a normal rhythm to the heart. A rhythm that is too fast or is not regular keeps the heart from pumping well. In this procedure, sticky patches or metal paddles are placed on the chest to deliver electricity to the heart from a device. This procedure may be done in an emergency if:  There is low or no blood pressure as a result of the heart rhythm.  Normal rhythm must be restored as fast as possible to protect the brain and heart from further damage.  It may save a life. This may also be a scheduled procedure for irregular or fast heart rhythms that are not immediately life-threatening. Tell a health care provider about:  Any allergies you have.  All medicines you are taking, including vitamins, herbs, eye drops, creams, and over-the-counter medicines.  Any problems you or family members have had with anesthetic medicines.  Any blood disorders you have.  Any surgeries you have had.  Any medical conditions you have.  Whether you are pregnant or may be pregnant. What are the risks? Generally, this is a safe procedure. However, problems may occur, including:  Allergic reactions to medicines.  A blood clot that breaks free and travels to other parts of your body.  The possible return of an abnormal heart rhythm within hours or days after the procedure.  Your heart stopping (cardiac arrest). This is rare. What happens before the procedure? Medicines  Your health care provider may have you start taking: ? Blood-thinning medicines (anticoagulants) so your blood does not clot as easily. ? Medicines to help stabilize your heart rate and rhythm.  Ask your health care provider about: ? Changing or stopping your regular medicines. This is especially important if you are taking diabetes medicines or blood thinners. ? Taking medicines such as aspirin and ibuprofen. These medicines can  thin your blood. Do not take these medicines unless your health care provider tells you to take them. ? Taking over-the-counter medicines, vitamins, herbs, and supplements. General instructions  Follow instructions from your health care provider about eating or drinking restrictions.  Plan to have someone take you home from the hospital or clinic.  If you will be going home right after the procedure, plan to have someone with you for 24 hours.  Ask your health care provider what steps will be taken to help prevent infection. These may include washing your skin with a germ-killing soap. What happens during the procedure?  An IV will be inserted into one of your veins.  Sticky patches (electrodes) or metal paddles may be placed on your chest.  You will be given a medicine to help you relax (sedative).  An electrical shock will be delivered. The procedure may vary among health care providers and hospitals.   What can I expect after the procedure?  Your blood pressure, heart rate, breathing rate, and blood oxygen level will be monitored until you leave the hospital or clinic.  Your heart rhythm will be watched to make sure it does not change.  You may have some redness on the skin where the shocks were given. Follow these instructions at home:  Do not drive for 24 hours if you were given a sedative during your procedure.  Take over-the-counter and prescription medicines only as told by your health care provider.  Ask your health care provider how to check your pulse. Check it often.  Rest for 48 hours after the procedure   or as told by your health care provider.  Avoid or limit your caffeine use as told by your health care provider.  Keep all follow-up visits as told by your health care provider. This is important. Contact a health care provider if:  You feel like your heart is beating too quickly or your pulse is not regular.  You have a serious muscle cramp that does not go  away. Get help right away if:  You have discomfort in your chest.  You are dizzy or you feel faint.  You have trouble breathing or you are short of breath.  Your speech is slurred.  You have trouble moving an arm or leg on one side of your body.  Your fingers or toes turn cold or blue. Summary  Electrical cardioversion is the delivery of a jolt of electricity to restore a normal rhythm to the heart.  This procedure may be done right away in an emergency or may be a scheduled procedure if the condition is not an emergency.  Generally, this is a safe procedure.  After the procedure, check your pulse often as told by your health care provider. This information is not intended to replace advice given to you by your health care provider. Make sure you discuss any questions you have with your health care provider. Document Revised: 03/09/2019 Document Reviewed: 03/09/2019 Elsevier Patient Education  2021 Elsevier Inc.  

## 2020-09-08 NOTE — Anesthesia Postprocedure Evaluation (Signed)
Anesthesia Post Note  Patient: Brianna Erickson  Procedure(s) Performed: CARDIOVERSION (N/A )     Patient location during evaluation: PACU Anesthesia Type: General Level of consciousness: awake and alert, oriented and patient cooperative Pain management: pain level controlled Vital Signs Assessment: post-procedure vital signs reviewed and stable Respiratory status: spontaneous breathing, nonlabored ventilation and respiratory function stable Cardiovascular status: blood pressure returned to baseline and stable Postop Assessment: no apparent nausea or vomiting Anesthetic complications: no   No complications documented.  Last Vitals:  Vitals:   09/08/20 1000 09/08/20 1010  BP: 113/69 113/69  Pulse: 67 65  Resp: 17 17  Temp:    SpO2: 99% 99%    Last Pain:  Vitals:   09/08/20 1010  TempSrc:   PainSc: 0-No pain                 Lannie Fields

## 2020-09-08 NOTE — Transfer of Care (Signed)
Immediate Anesthesia Transfer of Care Note  Patient: Brianna Erickson  Procedure(s) Performed: CARDIOVERSION (N/A )  Patient Location: Endoscopy Unit  Anesthesia Type:General  Level of Consciousness: awake, alert  and oriented  Airway & Oxygen Therapy: Patient Spontanous Breathing and Patient connected to nasal cannula oxygen  Post-op Assessment: Report given to RN, Post -op Vital signs reviewed and stable and Patient moving all extremities  Post vital signs: Reviewed and stable  Last Vitals:  Vitals Value Taken Time  BP    Temp    Pulse    Resp    SpO2      Last Pain:  Vitals:   09/08/20 0943  TempSrc: Temporal  PainSc: 0-No pain         Complications: No complications documented.

## 2020-09-08 NOTE — Anesthesia Preprocedure Evaluation (Addendum)
Anesthesia Evaluation  Patient identified by MRN, date of birth, ID band Patient awake    Reviewed: Allergy & Precautions, NPO status , Patient's Chart, lab work & pertinent test results, reviewed documented beta blocker date and time   Airway Mallampati: II  TM Distance: >3 FB Neck ROM: Full    Dental  (+) Teeth Intact, Dental Advisory Given   Pulmonary Current Smoker and Patient abstained from smoking.,  10 cig/d COVID + one month ago, was given albuterol inhaler Uses inhaler 1-2x/d   Pulmonary exam normal breath sounds clear to auscultation       Cardiovascular + dysrhythmias (xarelto) Atrial Fibrillation  Rhythm:Irregular Rate:Normal  Echo 07/2020: 1. Left ventricular ejection fraction, by estimation, is 70 to 75%. The  left ventricle has hyperdynamic function. The left ventricle has no  regional wall motion abnormalities. There is mild left ventricular  hypertrophy. Left ventricular diastolic  parameters are indeterminate.  2. Right ventricular systolic function is normal. The right ventricular  size is normal. Tricuspid regurgitation signal is inadequate for assessing  PA pressure.  3. A small pericardial effusion is present. The pericardial effusion is  posterior to the left ventricle.  4. The mitral valve is normal in structure. Trivial mitral valve  regurgitation. No evidence of mitral stenosis.  5. The aortic valve is tricuspid. Aortic valve regurgitation is not  visualized. No aortic stenosis is present.  6. Aortic dilatation noted. There is mild dilatation of the ascending  aorta, measuring 40 mm.  7. The inferior vena cava is dilated in size with <50% respiratory  variability, suggesting right atrial pressure of 15 mmHg.    Neuro/Psych PSYCHIATRIC DISORDERS Anxiety Depression negative neurological ROS     GI/Hepatic GERD  Controlled,(+)     substance abuse  alcohol use, S/p Gastric bypass    Endo/Other  Morbid obesityBMI 51  Renal/GU negative Renal ROS  negative genitourinary   Musculoskeletal negative musculoskeletal ROS (+)   Abdominal (+) + obese,   Peds  Hematology negative hematology ROS (+)   Anesthesia Other Findings   Reproductive/Obstetrics negative OB ROS                           Anesthesia Physical Anesthesia Plan  ASA: IV  Anesthesia Plan: General   Post-op Pain Management:    Induction: Intravenous  PONV Risk Score and Plan: TIVA and Treatment may vary due to age or medical condition  Airway Management Planned: Natural Airway and Mask  Additional Equipment: None  Intra-op Plan:   Post-operative Plan:   Informed Consent: I have reviewed the patients History and Physical, chart, labs and discussed the procedure including the risks, benefits and alternatives for the proposed anesthesia with the patient or authorized representative who has indicated his/her understanding and acceptance.       Plan Discussed with: CRNA  Anesthesia Plan Comments:        Anesthesia Quick Evaluation

## 2020-09-08 NOTE — Interval H&P Note (Signed)
History and Physical Interval Note:  09/08/2020 9:39 AM  Brianna Erickson  has presented today for surgery, with the diagnosis of A FLUTTER.  The various methods of treatment have been discussed with the patient and family. After consideration of risks, benefits and other options for treatment, the patient has consented to  Procedure(s): CARDIOVERSION (N/A) as a surgical intervention.  The patient's history has been reviewed, patient examined, no change in status, stable for surgery.  I have reviewed the patient's chart and labs.  Questions were answered to the patient's satisfaction.     [employer]

## 2020-09-08 NOTE — CV Procedure (Signed)
    Electrical Cardioversion Procedure Note Brianna Erickson [id number] 04-Aug-1966  Procedure: Electrical Cardioversion Indications:  Atrial Flutter  Time Out: Verified patient identification, verified procedure,medications/allergies/relevent history reviewed, required imaging and test results available.  Performed  Procedure Details  The patient was NPO after midnight. Anesthesia was administered at the beside  by Dr. Salvadore Farber with 100mg  propofol.  Cardioversion was performed with synchronized biphasic defibrillation via AP pads with 120 joules.  1 attempt(s) were performed.  The patient converted to normal sinus rhythm. The patient tolerated the procedure well   IMPRESSION:  Successful cardioversion of atrial flutter.   Brianna Erickson 09/08/2020, 10:08 AM

## 2020-09-11 ENCOUNTER — Encounter (HOSPITAL_COMMUNITY): Payer: Self-pay | Admitting: Cardiology

## 2020-09-14 ENCOUNTER — Telehealth: Payer: Self-pay

## 2020-09-14 NOTE — Telephone Encounter (Signed)
Received faxed orders from [facility] Faxed back to them with note that patient has never been seen in this office. Also, Burna Mortimer with [facility] called earlier in the day and was verbally told this same information.

## 2020-09-15 ENCOUNTER — Inpatient Hospital Stay (HOSPITAL_COMMUNITY): Admission: RE | Admit: 2020-09-15 | Payer: 59 | Source: Ambulatory Visit | Admitting: Physician Assistant

## 2020-09-15 NOTE — Progress Notes (Incomplete)
Primary Care Physician: Verl Blalock Referring Physician: Dr. Allena Katz,  [facility] f/u    Brianna Erickson is a 55 y.o. female with a h/o foranxiety/depression, alcohol and tobacco use, obesity s/p gastric bypass surgery who presented to the ED for evaluation of progressive shortness of breath and lower extremity swelling. Patient reports over the last 2-3 months of having progressive lower extremity swelling which started on her feet and is now up to her abdomen. She has had an associated 40 pound weight gain over the last 2 months. She has been having increasing dyspnea on exertion and orthopnea.  She does not follow with a PCP, has been followed by Psychiatry. Positive for tobacco use,  smoking a pack per day for at least 20 years. She reports alcohol use, usually drinking a sixpack of beer on Wednesdays and Fridays/Saturdays.She reports occasional marijuana use.   She was found to be in new onset typical atrial flutter with RVR 07/20/20 and was admitted. Treated with IV cardoizem, pt waw unable to tolerate the po Cardizem capsule so was placed on metoprolol tartrate. She was placed on anticoagulation, xarelto 20 mg daily for a CHA2DS2VASc score of 1.   On follow up today, patient is s/p DCCV 09/08/20. ***  Today, she denies symptoms of ***palpitations, chest pain, shortness of breath, orthopnea, PND, lower extremity edema, dizziness, presyncope, syncope, or neurologic sequela. The patient is tolerating medications without difficulties and is otherwise without complaint today.   Past Medical History:  Diagnosis Date  . Alcohol use   . Depression with anxiety    Past Surgical History:  Procedure Laterality Date  . CARDIOVERSION N/A 09/08/2020   Procedure: CARDIOVERSION;  Surgeon: Jake Bathe, MD;  Location: [facility] ENDOSCOPY;  Service: Cardiovascular;  Laterality: N/A;  . CHOLECYSTECTOMY    . GASTRIC BYPASS      Current Outpatient Medications  Medication Sig Dispense Refill  .  albuterol (VENTOLIN HFA) 108 (90 Base) MCG/ACT inhaler Inhale 2 puffs into the lungs every 6 (six) hours as needed for wheezing or shortness of breath.    . ALPRAZolam (XANAX) 0.5 MG tablet Take 0.5 mg by mouth 3 (three) times daily as needed for anxiety.     Marland Kitchen amitriptyline (ELAVIL) 25 MG tablet Take 25 mg by mouth at bedtime as needed for sleep.    Marland Kitchen buPROPion (WELLBUTRIN XL) 300 MG 24 hr tablet Take 300 mg by mouth daily.    . Cyanocobalamin (VITAMIN B12 PO) Take 3,000 mcg by mouth daily. Chews gummy    . escitalopram (LEXAPRO) 20 MG tablet Take 20 mg by mouth daily.    . furosemide (LASIX) 40 MG tablet Take 1 tablet (40 mg total) by mouth daily as needed for fluid or edema (weight gain of 3Lbs in 1 day or 5Lbs in 2 days). (Patient taking differently: Take 40 mg by mouth 2 (two) times daily.) 60 tablet 0  . metoprolol tartrate (LOPRESSOR) 50 MG tablet Take 1 tablet (50 mg total) by mouth 2 (two) times daily. 60 tablet 0  . Multiple Vitamin (MULTIVITAMIN ADULT PO) Take 1 capsule by mouth daily. Chews one gummy by mouth daily Woman's    . nicotine (NICODERM CQ - DOSED IN MG/24 HOURS) 21 mg/24hr patch Place 1 patch (21 mg total) onto the skin daily. (Patient not taking: No sig reported) 28 patch 0  . pantoprazole (PROTONIX) 40 MG tablet Take 1 tablet (40 mg total) by mouth daily. 30 tablet 0  . potassium chloride SA (KLOR-CON)  20 MEQ tablet Take 1 tablet (20 mEq total) by mouth 2 (two) times daily. 30 tablet 0  . rivaroxaban (XARELTO) 20 MG TABS tablet Take 1 tablet (20 mg total) by mouth daily with supper. 30 tablet 0  . VRAYLAR capsule Take 1.5 mg by mouth daily.     No current facility-administered medications for this visit.    No Known Allergies  Social History   Socioeconomic History  . Marital status: Divorced    Spouse name: Not on file  . Number of children: Not on file  . Years of education: Not on file  . Highest education level: Not on file  Occupational History  . Not on  file  Tobacco Use  . Smoking status: Current Every Day Smoker    Types: Cigarettes  . Smokeless tobacco: Former Neurosurgeon  . Tobacco comment: 10 cigarettes daily  Vaping Use  . Vaping Use: Never used  Substance and Sexual Activity  . Alcohol use: Yes    Alcohol/week: 6.0 standard drinks    Types: 6 Cans of beer per week    Comment: ocassional  . Drug use: Not Currently    Types: Marijuana  . Sexual activity: Not Currently    Partners: Male  Other Topics Concern  . Not on file  Social History Narrative  . Not on file   Social Determinants of Health   Financial Resource Strain: Not on file  Food Insecurity: Not on file  Transportation Needs: Not on file  Physical Activity: Not on file  Stress: Not on file  Social Connections: Not on file  Intimate Partner Violence: Not on file    Family History  Problem Relation Age of Onset  . Heart disease Father     ROS- All systems are reviewed and negative except as per the HPI above  Physical Exam: There were no vitals filed for this visit. Wt Readings from Last 3 Encounters:  09/08/20 (!) 166.9 kg  08/24/20 (!) 166.6 kg  08/03/20 (!) 170.9 kg    Labs: Lab Results  Component Value Date   NA 140 09/08/2020   K 3.9 09/08/2020   CL 102 09/08/2020   CO2 26 08/24/2020   GLUCOSE 98 09/08/2020   BUN 11 09/08/2020   CREATININE 0.70 09/08/2020   CALCIUM 9.6 08/24/2020   MG 2.0 07/24/2020   Lab Results  Component Value Date   INR 1.2 07/20/2020   Lab Results  Component Value Date   CHOL 85 07/21/2020   HDL 23 (L) 07/21/2020   LDLCALC 49 07/21/2020   TRIG 64 07/21/2020    GEN- The patient is well appearing, alert and oriented x 3 today.   HEENT-head normocephalic, atraumatic, sclera clear, conjunctiva pink, hearing intact, trachea midline. Lungs- Clear to ausculation bilaterally, normal work of breathing Heart- ***Regular rate and rhythm, no murmurs, rubs or gallops  GI- soft, NT, ND, + BS Extremities- no clubbing,  cyanosis, or edema MS- no significant deformity or atrophy Skin- no rash or lesion Psych- euthymic mood, full affect Neuro- strength and sensation are intact   EKG-  ***  Ekg's in Epic reviewed and all appear to show typical atrial flutter.    Echo-1. Left ventricular ejection fraction, by estimation, is 70 to 75%. The left ventricle has hyperdynamic function. The left ventricle has no regional wall motion abnormalities. There is mild left ventricular hypertrophy. Left ventricular diastolic parameters are indeterminate. 2. Right ventricular systolic function is normal. The right ventricular size is normal. Tricuspid regurgitation signal  is inadequate for assessing PA pressure. 3. A small pericardial effusion is present. The pericardial effusion is posterior to the left ventricle. 4. The mitral valve is normal in structure. Trivial mitral valve regurgitation. No evidence of mitral stenosis. 5. The aortic valve is tricuspid. Aortic valve regurgitation is not visualized. No aortic stenosis is present. 6. Aortic dilatation noted. There is mild dilatation of the ascending aorta, measuring 40 mm. 7. The inferior vena cava is dilated in size with <50% respiratory variability, suggesting right atrial pressure of 15 mmHg.   Assessment and Plan:  1. Typical atrial flutter S/p DCCV on 09/08/20. ***osa? Per Dr Lalla Brothers, will place a 2 week Zio patch post DCCV to screen for afib. If no afib and recurrent typical atrial flutter is seen on monitor, he would consider her for an ablation. Continue Xarelto 20 mg daily Continue Lopressor 50 mg BID  2. CHA2DS2VASc score of 1 Continue xarelto 20 mg daily  3. Chronic diastolic HF No signs or symptoms of fluid overload. ***  4. Obesity There is no height or weight on file to calculate BMI. S/p gastric bypass Lifestyle modification was discussed and encouraged including regular physical activity and weight reduction. ***   Follow up  ***   Jorja Loa PA-C [facility] [address] [place], [place] [address] [phone]

## 2020-09-20 ENCOUNTER — Ambulatory Visit (HOSPITAL_COMMUNITY): Payer: 59 | Admitting: Physician Assistant

## 2020-09-21 NOTE — Telephone Encounter (Signed)
Again received faxed orders from [facility]. Faxed back to them with note this is not a patient in our office.

## 2020-09-23 ENCOUNTER — Ambulatory Visit (HOSPITAL_COMMUNITY)
Admission: RE | Admit: 2020-09-23 | Discharge: 2020-09-23 | Disposition: A | Discharge status: Home / Self Care | Payer: 59 | Source: Ambulatory Visit | Attending: Physician Assistant | Admitting: Physician Assistant

## 2020-09-23 ENCOUNTER — Other Ambulatory Visit: Payer: Self-pay

## 2020-09-23 VITALS — BP 126/72 | HR 59 | Ht 71.0 in | Wt 372.4 lb

## 2020-09-23 DIAGNOSIS — Z95810 Presence of automatic (implantable) cardiac defibrillator: Secondary | ICD-10-CM | POA: Insufficient documentation

## 2020-09-23 DIAGNOSIS — E669 Obesity, unspecified: Secondary | ICD-10-CM | POA: Insufficient documentation

## 2020-09-23 DIAGNOSIS — Z6841 Body Mass Index (BMI) 40.0 and over, adult: Secondary | ICD-10-CM | POA: Insufficient documentation

## 2020-09-23 DIAGNOSIS — I483 Typical atrial flutter: Secondary | ICD-10-CM | POA: Insufficient documentation

## 2020-09-23 DIAGNOSIS — F1010 Alcohol abuse, uncomplicated: Secondary | ICD-10-CM | POA: Insufficient documentation

## 2020-09-23 DIAGNOSIS — Z79899 Other long term (current) drug therapy: Secondary | ICD-10-CM | POA: Insufficient documentation

## 2020-09-23 DIAGNOSIS — R0602 Shortness of breath: Secondary | ICD-10-CM | POA: Insufficient documentation

## 2020-09-23 DIAGNOSIS — I5032 Chronic diastolic (congestive) heart failure: Secondary | ICD-10-CM | POA: Diagnosis not present

## 2020-09-23 DIAGNOSIS — Z7901 Long term (current) use of anticoagulants: Secondary | ICD-10-CM | POA: Insufficient documentation

## 2020-09-23 DIAGNOSIS — F17210 Nicotine dependence, cigarettes, uncomplicated: Secondary | ICD-10-CM | POA: Insufficient documentation

## 2020-09-23 DIAGNOSIS — G4733 Obstructive sleep apnea (adult) (pediatric): Secondary | ICD-10-CM | POA: Insufficient documentation

## 2020-09-23 DIAGNOSIS — Z713 Dietary counseling and surveillance: Secondary | ICD-10-CM | POA: Insufficient documentation

## 2020-09-23 DIAGNOSIS — Z8249 Family history of ischemic heart disease and other diseases of the circulatory system: Secondary | ICD-10-CM | POA: Diagnosis not present

## 2020-09-23 NOTE — Patient Instructions (Signed)
You can stop Xarelto on February 17th  Follow up with Dr Lalla Brothers in 2 months. Their office will call to setup appointment.

## 2020-09-23 NOTE — Progress Notes (Addendum)
Primary Care Physician: Verl Blalock Referring Physician: Dr. Allena Katz,  [facility] f/u    Brianna Erickson is a 55 y.o. female with a h/o foranxiety/depression, alcohol and tobacco use, obesity s/p gastric bypass surgery who presented to the ED for evaluation of progressive shortness of breath and lower extremity swelling. Patient reports over the last 2-3 months of having progressive lower extremity swelling which started on her feet and is now up to her abdomen. She has had an associated 40 pound weight gain over the last 2 months. She has been having increasing dyspnea on exertion and orthopnea.  She does not follow with a PCP, has been followed by Psychiatry. Positive for tobacco use,  smoking a pack per day for at least 20 years. She reports alcohol use, usually drinking a sixpack of beer on Wednesdays and Fridays/Saturdays.She reports occasional marijuana use.   She was found to be in new onset typical atrial flutter with RVR 07/20/20 and was admitted. Treated with IV cardoizem, pt waw unable to tolerate the po Cardizem capsule so was placed on metoprolol tartrate. She was placed on anticoagulation, xarelto 20 mg daily for a CHA2DS2VASc score of 1.   On follow up today, patient is s/p DCCV 09/08/20. She reports that she feels much better with more energy and less SOB. She denies any bleeding issues on anticoagulation. She continues to use alcohol heavily, she drank 12 beers yesterday.   Today, she denies symptoms of palpitations, chest pain, shortness of breath, orthopnea, PND, lower extremity edema, dizziness, presyncope, syncope, or neurologic sequela. The patient is tolerating medications without difficulties and is otherwise without complaint today.   Past Medical History:  Diagnosis Date  . Alcohol use   . Depression with anxiety    Past Surgical History:  Procedure Laterality Date  . CARDIOVERSION N/A 09/08/2020   Procedure: CARDIOVERSION;  Surgeon: Jake Bathe, MD;   Location: [facility] ENDOSCOPY;  Service: Cardiovascular;  Laterality: N/A;  . CHOLECYSTECTOMY    . GASTRIC BYPASS      Current Outpatient Medications  Medication Sig Dispense Refill  . albuterol (VENTOLIN HFA) 108 (90 Base) MCG/ACT inhaler Inhale 2 puffs into the lungs every 6 (six) hours as needed for wheezing or shortness of breath.    . ALPRAZolam (XANAX) 0.5 MG tablet Take 0.5 mg by mouth 3 (three) times daily as needed for anxiety.     Marland Kitchen amitriptyline (ELAVIL) 25 MG tablet Take 25 mg by mouth at bedtime as needed for sleep.    Marland Kitchen buPROPion (WELLBUTRIN XL) 300 MG 24 hr tablet Take 300 mg by mouth daily.    . Cyanocobalamin (VITAMIN B12 PO) Take 3,000 mcg by mouth daily. Chews gummy    . escitalopram (LEXAPRO) 20 MG tablet Take 20 mg by mouth daily.    . furosemide (LASIX) 40 MG tablet Take 1 tablet (40 mg total) by mouth daily as needed for fluid or edema (weight gain of 3Lbs in 1 day or 5Lbs in 2 days). (Patient taking differently: Take 40 mg by mouth 2 (two) times daily.) 60 tablet 0  . metoprolol tartrate (LOPRESSOR) 50 MG tablet Take 1 tablet (50 mg total) by mouth 2 (two) times daily. 60 tablet 0  . Multiple Vitamin (MULTIVITAMIN ADULT PO) Take 1 capsule by mouth daily. Chews one gummy by mouth daily Woman's    . nicotine (NICODERM CQ - DOSED IN MG/24 HOURS) 21 mg/24hr patch Place 1 patch (21 mg total) onto the skin daily. 28 patch 0  .  pantoprazole (PROTONIX) 40 MG tablet Take 1 tablet (40 mg total) by mouth daily. 30 tablet 0  . potassium chloride SA (KLOR-CON) 20 MEQ tablet Take 1 tablet (20 mEq total) by mouth 2 (two) times daily. 30 tablet 0  . rivaroxaban (XARELTO) 20 MG TABS tablet Take 1 tablet (20 mg total) by mouth daily with supper. 30 tablet 0  . VRAYLAR capsule Take 1.5 mg by mouth daily.     No current facility-administered medications for this encounter.    No Known Allergies  Social History   Socioeconomic History  . Marital status: Divorced    Spouse name: Not on  file  . Number of children: Not on file  . Years of education: Not on file  . Highest education level: Not on file  Occupational History  . Not on file  Tobacco Use  . Smoking status: Current Every Day Smoker    Types: Cigarettes  . Smokeless tobacco: Former Neurosurgeon  . Tobacco comment: 10 cigarettes daily  Vaping Use  . Vaping Use: Never used  Substance and Sexual Activity  . Alcohol use: Yes    Alcohol/week: 6.0 standard drinks    Types: 6 Cans of beer per week    Comment: ocassional  . Drug use: Not Currently    Types: Marijuana  . Sexual activity: Not Currently    Partners: Male  Other Topics Concern  . Not on file  Social History Narrative  . Not on file   Social Determinants of Health   Financial Resource Strain: Not on file  Food Insecurity: Not on file  Transportation Needs: Not on file  Physical Activity: Not on file  Stress: Not on file  Social Connections: Not on file  Intimate Partner Violence: Not on file    Family History  Problem Relation Age of Onset  . Heart disease Father     ROS- All systems are reviewed and negative except as per the HPI above  Physical Exam: Vitals:   09/23/20 1111  BP: 126/72  Pulse: (!) 59  Weight: (!) 168.9 kg  Height: 5\' 11"  (1.803 m)   Wt Readings from Last 3 Encounters:  09/23/20 (!) 168.9 kg  09/08/20 (!) 166.9 kg  08/24/20 (!) 166.6 kg    Labs: Lab Results  Component Value Date   NA 140 09/08/2020   K 3.9 09/08/2020   CL 102 09/08/2020   CO2 26 08/24/2020   GLUCOSE 98 09/08/2020   BUN 11 09/08/2020   CREATININE 0.70 09/08/2020   CALCIUM 9.6 08/24/2020   MG 2.0 07/24/2020   Lab Results  Component Value Date   INR 1.2 07/20/2020   Lab Results  Component Value Date   CHOL 85 07/21/2020   HDL 23 (L) 07/21/2020   LDLCALC 49 07/21/2020   TRIG 64 07/21/2020    GEN- The patient is well appearing obese female, alert and oriented x 3 today.   HEENT-head normocephalic, atraumatic, sclera clear,  conjunctiva pink, hearing intact, trachea midline. Lungs- Clear to ausculation bilaterally, normal work of breathing Heart- Regular rate and rhythm, no murmurs, rubs or gallops  GI- soft, NT, ND, + BS Extremities- no clubbing, cyanosis. Non pitting edema, chronic stasis changes. MS- no significant deformity or atrophy Skin- no rash or lesion Psych- euthymic mood, full affect Neuro- strength and sensation are intact   EKG-  SB, 1st degree AV block Vent. rate 59 BPM PR interval 210 ms QRS duration 82 ms QT/QTc 418/413 ms  Ekg's in Epic reviewed  and all appear to show typical atrial flutter.    Echo-1. Left ventricular ejection fraction, by estimation, is 70 to 75%. The left ventricle has hyperdynamic function. The left ventricle has no regional wall motion abnormalities. There is mild left ventricular hypertrophy. Left ventricular diastolic parameters are indeterminate. 2. Right ventricular systolic function is normal. The right ventricular size is normal. Tricuspid regurgitation signal is inadequate for assessing PA pressure. 3. A small pericardial effusion is present. The pericardial effusion is posterior to the left ventricle. 4. The mitral valve is normal in structure. Trivial mitral valve regurgitation. No evidence of mitral stenosis. 5. The aortic valve is tricuspid. Aortic valve regurgitation is not visualized. No aortic stenosis is present. 6. Aortic dilatation noted. There is mild dilatation of the ascending aorta, measuring 40 mm. 7. The inferior vena cava is dilated in size with <50% respiratory variability, suggesting right atrial pressure of 15 mmHg.   Assessment and Plan:  1. Typical atrial flutter S/p DCCV on 09/08/20. She appears to be maintaining SR.  Her Zio patch results are pending (just mailed back yesterday) Continue Xarelto 20 mg daily for 4 weeks post DCCV. Given her low CV score, will stop at that point.  Continue Lopressor 50 mg BID Lifestyle  modification was discussed including alcohol reduction/cessation.  2. CHA2DS2VASc score of 1 Continue xarelto 20 mg daily for now.  3. Chronic diastolic HF No signs or symptoms of fluid overload.  4. Obesity Body mass index is 51.94 kg/m. S/p gastric bypass Lifestyle modification was discussed and encouraged including regular physical activity and weight reduction.  5. Suspected OSA She had a sleep study scheduled but this had to be cancelled due to her positive COVID test. Encouraged her to reschedule.    Patient would like to establish with cardiologist. Will refer to EP given her h/o atrial flutter.    Jorja Loa PA-C [facility] [address] [place], [place] [address] [phone]

## 2020-09-28 ENCOUNTER — Other Ambulatory Visit (HOSPITAL_COMMUNITY): Payer: Self-pay | Admitting: *Deleted

## 2020-09-28 DIAGNOSIS — I4892 Unspecified atrial flutter: Secondary | ICD-10-CM

## 2020-09-28 NOTE — Addendum Note (Signed)
Encounter addended by: Shona Simpson, RN on: 09/28/2020 2:36 PM  Actions taken: Imaging Exam ended

## 2020-10-03 ENCOUNTER — Encounter (HOSPITAL_COMMUNITY): Payer: Self-pay | Admitting: *Deleted

## 2020-11-29 ENCOUNTER — Institutional Professional Consult (permissible substitution): Payer: 59 | Admitting: Cardiology

## 2020-12-13 ENCOUNTER — Telehealth (HOSPITAL_COMMUNITY): Payer: Self-pay | Admitting: *Deleted

## 2020-12-13 MED ORDER — RIVAROXABAN 20 MG PO TABS: 20.0000 mg | ORAL_TABLET | Freq: Every day | ORAL | 2 refills | Status: AC

## 2020-12-13 NOTE — Telephone Encounter (Signed)
Patient called in stating since 4/20 she has noticed her HRs varying from 68-110 whereas after her cardioversion she has been steady in the 60s.  She does states she quit drinking alcohol as of last week and wonders if this could have caused her episode.  I discussed with Jorja Loa PA he would like to restart her Xarelto 20mg  once a day and keep scheduled follow up with Dr. - pt to call me back if she should develop symptoms prior to this appt. Pt in agreement.

## 2021-01-02 NOTE — Progress Notes (Signed)
Electrophysiology Office Note:    Date:  01/03/2021   ID:  Brianna Erickson, DOB [birth year], MRN [id number]  PCP:  Verl Blalock  [facility] Cardiologist:  None  [facility] Electrophysiologist:  Lanier Prude, MD   Referring MD: Brianna Goltz, PA   Chief Complaint: Atrial flutter  History of Present Illness:    Brianna Erickson is a 55 y.o. female who presents for an evaluation of atrial flutter at the request of Brianna Loa, PA-C. Their medical history includes alcohol use, depression and anxiety.  She was seen by Brianna Erickson in the A. fib clinic on September 23, 2020.  This appointment was in the setting of a recent diagnosis of atrial flutter made on July 20, 2020.  She is on Xarelto for stroke prophylaxis.  She had been cardioverted on September 08, 2020.  She was symptomatic while in atrial flutter with dyspnea on exertion and weakness.  She has previously been a heavy drinker but has significantly cut back.  Today she is concerned that she may be back in atrial flutter.  She also is concerned that she is gained at least 20 pounds with significant swelling in her lower extremities.  She continues to take her Lasix twice daily but does not notice a significant change in her urine output after taking a dose.    Past Medical History:  Diagnosis Date  . Alcohol use   . Depression with anxiety     Past Surgical History:  Procedure Laterality Date  . CARDIOVERSION N/A 09/08/2020   Procedure: CARDIOVERSION;  Surgeon: Brianna Bathe, MD;  Location: [facility] ENDOSCOPY;  Service: Cardiovascular;  Laterality: N/A;  . CHOLECYSTECTOMY    . GASTRIC BYPASS      Current Medications: Current Meds  Medication Sig  . albuterol (VENTOLIN HFA) 108 (90 Base) MCG/ACT inhaler Inhale 2 puffs into the lungs every 6 (six) hours as needed for wheezing or shortness of breath.  . ALPRAZolam (XANAX) 0.5 MG tablet Take 0.5 mg by mouth 3 (three) times daily as needed for anxiety.   Marland Kitchen  amitriptyline (ELAVIL) 25 MG tablet Take 25 mg by mouth at bedtime as needed for sleep.  Marland Kitchen buPROPion (WELLBUTRIN XL) 300 MG 24 hr tablet Take 300 mg by mouth daily.  . Cyanocobalamin (VITAMIN B12 PO) Take 3,000 mcg by mouth daily. Chews gummy  . escitalopram (LEXAPRO) 20 MG tablet Take 20 mg by mouth daily.  . metoprolol tartrate (LOPRESSOR) 50 MG tablet Take 1 tablet (50 mg total) by mouth 2 (two) times daily.  . Multiple Vitamin (MULTIVITAMIN ADULT PO) Take 1 capsule by mouth daily. Chews one gummy by mouth daily Woman's  . nicotine (NICODERM CQ - DOSED IN MG/24 HOURS) 21 mg/24hr patch Place 1 patch (21 mg total) onto the skin daily.  . pantoprazole (PROTONIX) 40 MG tablet Take 1 tablet (40 mg total) by mouth daily.  . potassium chloride SA (KLOR-CON) 20 MEQ tablet Take 1 tablet (20 mEq total) by mouth 2 (two) times daily.  . rivaroxaban (XARELTO) 20 MG TABS tablet Take 1 tablet (20 mg total) by mouth daily with supper.  . torsemide (DEMADEX) 20 MG tablet Take 2 tablets (40 mg total) by mouth 2 (two) times daily.  Marland Kitchen VRAYLAR capsule Take 1.5 mg by mouth daily.  . [DISCONTINUED] furosemide (LASIX) 40 MG tablet Take 1 tablet (40 mg total) by mouth daily as needed for fluid or edema (weight gain of 3Lbs in 1 day or 5Lbs in  2 days). (Patient taking differently: Take 40 mg by mouth 2 (two) times daily.)     Allergies:   Patient has no known allergies.   Social History   Socioeconomic History  . Marital status: Divorced    Spouse name: Not on file  . Number of children: Not on file  . Years of education: Not on file  . Highest education level: Not on file  Occupational History  . Not on file  Tobacco Use  . Smoking status: Current Every Day Smoker    Types: Cigarettes  . Smokeless tobacco: Former Neurosurgeon  . Tobacco comment: 10 cigarettes daily  Vaping Use  . Vaping Use: Never used  Substance and Sexual Activity  . Alcohol use: Yes    Alcohol/week: 6.0 standard drinks    Types: 6 Cans of  beer per week    Comment: ocassional  . Drug use: Not Currently    Types: Marijuana  . Sexual activity: Not Currently    Partners: Male  Other Topics Concern  . Not on file  Social History Narrative  . Not on file   Social Determinants of Health   Financial Resource Strain: Not on file  Food Insecurity: Not on file  Transportation Needs: Not on file  Physical Activity: Not on file  Stress: Not on file  Social Connections: Not on file     Family History: The patient's family history includes Heart disease in her father.  ROS:   Please see the history of present illness.    All other systems reviewed and are negative.  EKGs/Labs/Other Studies Reviewed:    The following studies were reviewed today:  September 29, 2020 ZIO personally reviewed HR 51-156, average 74. 11 episodes of SVT, longest lasting 7 beats. Rare supraventricular and ventricular ectopy.  July 21, 2020 echo personally reviewed Left ventricular function normal, 70% Right ventricular function normal Small pericardial effusion Mitral valve is normal   EKG:  The ekg ordered today demonstrates normal sinus rhythm.  First-degree AV delay  Recent Labs: 07/20/2020: ALT 17; B Natriuretic Peptide 88.7 07/24/2020: Magnesium 2.0 08/24/2020: Platelets 354 09/08/2020: BUN 11; Creatinine, Ser 0.70; Hemoglobin 13.9; Potassium 3.9; Sodium 140  Recent Lipid Panel    Component Value Date/Time   CHOL 85 07/21/2020 0443   TRIG 64 07/21/2020 0443   HDL 23 (L) 07/21/2020 0443   CHOLHDL 3.7 07/21/2020 0443   VLDL 13 07/21/2020 0443   LDLCALC 49 07/21/2020 0443    Physical Exam:    VS:  BP 106/60   Pulse (!) 56   Ht 5\' 10"  (1.778 m)   Wt (!) 384 lb (174.2 kg)   SpO2 96%   BMI 55.10 kg/m     Wt Readings from Last 3 Encounters:  01/03/21 (!) 384 lb (174.2 kg)  09/23/20 (!) 372 lb 6.4 oz (168.9 kg)  09/08/20 (!) 368 lb (166.9 kg)     GEN: Well nourished, well developed in no acute distress.  Obese HEENT:  Normal NECK: No JVD; No carotid bruits LYMPHATICS: No lymphadenopathy CARDIAC: RRR, no murmurs, rubs, gallops RESPIRATORY:  Clear to auscultation without rales, wheezing or rhonchi  ABDOMEN: Soft, non-tender, non-distended MUSCULOSKELETAL: 2-3+ pitting bilateral lower extremity edema to the thighs; No deformity  SKIN: Warm and dry NEUROLOGIC:  Alert and oriented x 3 PSYCHIATRIC:  Normal affect   ASSESSMENT:    1. Typical atrial flutter (HCC)   2. Chronic diastolic heart failure (HCC)    PLAN:    In order of  problems listed above:  1. Typical atrial flutter Maintaining normal rhythm.  Continues to take Xarelto for stroke prophylaxis.  On metoprolol tartrate 50 mg by mouth twice daily.  She is not a candidate for invasive management of her atrial arrhythmias.  If she has recurrent atrial flutter, would need to initiate antiarrhythmic therapy.  2.  Acute on chronic diastolic heart failure NYHA class III.  Warm and wet. The Lasix is ineffective.  We will transition to torsemide 40 mg by mouth twice daily.  We will see her back in the next 1 to 2 weeks for repeat physical exam and further adjustment of her diuretics.  She will need repeat blood work at this appointment to check her kidney function and electrolytes.  I suspect she has at least 20 to 30 pounds of excess fluid based on my physical exam. She will continue taking her potassium supplement along with her diuretic.   Medication Adjustments/Labs and Tests Ordered: Current medicines are reviewed at length with the patient today.  Concerns regarding medicines are outlined above.  Orders Placed This Encounter  Procedures  . EKG 12-Lead   Meds ordered this encounter  Medications  . torsemide (DEMADEX) 20 MG tablet    Sig: Take 2 tablets (40 mg total) by mouth 2 (two) times daily.    Dispense:  360 tablet    Refill:  3     Signed, Niv Darley T. Lalla Brothers, MD, [facility], [facility] 01/03/2021 1:30 PM    Electrophysiology [facility]

## 2021-01-03 ENCOUNTER — Ambulatory Visit (INDEPENDENT_AMBULATORY_CARE_PROVIDER_SITE_OTHER): Payer: 59 | Admitting: Cardiology

## 2021-01-03 ENCOUNTER — Encounter: Payer: Self-pay | Admitting: Cardiology

## 2021-01-03 ENCOUNTER — Other Ambulatory Visit: Payer: Self-pay

## 2021-01-03 VITALS — BP 106/60 | HR 56 | Ht 70.0 in | Wt 384.0 lb

## 2021-01-03 DIAGNOSIS — I483 Typical atrial flutter: Secondary | ICD-10-CM | POA: Diagnosis not present

## 2021-01-03 DIAGNOSIS — I5032 Chronic diastolic (congestive) heart failure: Secondary | ICD-10-CM

## 2021-01-03 MED ORDER — TORSEMIDE 20 MG PO TABS: 40.0000 mg | ORAL_TABLET | Freq: Two times a day (BID) | ORAL | 3 refills | Status: AC

## 2021-01-03 NOTE — Patient Instructions (Addendum)
Medication Instructions:  Stop Lasix  Start Torsemide 20 mg tablets, take 2 tablets 2 times a day Your physician recommends that you continue on your current medications as directed. Please refer to the Current Medication list given to you today.  Labwork: None ordered.  Testing/Procedures: None ordered.  Follow-Up: Your physician wants you to follow-up in: 01/13/21 at 12:10 pm    Casimiro Needle "Mardelle Matte" Lanna Poche, PA-C      Any Other Special Instructions Will Be Listed Below (If Applicable).  If you need a refill on your cardiac medications before your next appointment, please call your pharmacy.

## 2021-01-13 ENCOUNTER — Ambulatory Visit: Payer: 59 | Admitting: Student

## 2021-04-04 ENCOUNTER — Other Ambulatory Visit (HOSPITAL_COMMUNITY): Payer: Self-pay

## 2021-04-04 MED ORDER — APIXABAN 5 MG PO TABS: 5.0000 mg | ORAL_TABLET | Freq: Two times a day (BID) | ORAL | 0 refills | Status: DC

## 2021-04-10 ENCOUNTER — Ambulatory Visit (INDEPENDENT_AMBULATORY_CARE_PROVIDER_SITE_OTHER): Payer: 59 | Admitting: Student

## 2021-04-10 ENCOUNTER — Other Ambulatory Visit: Payer: Self-pay

## 2021-04-10 ENCOUNTER — Encounter: Payer: Self-pay | Admitting: Student

## 2021-04-10 ENCOUNTER — Telehealth: Payer: Self-pay | Admitting: Cardiology

## 2021-04-10 VITALS — BP 100/60 | HR 68 | Ht 71.0 in | Wt 378.0 lb

## 2021-04-10 DIAGNOSIS — I483 Typical atrial flutter: Secondary | ICD-10-CM | POA: Diagnosis not present

## 2021-04-10 DIAGNOSIS — I5032 Chronic diastolic (congestive) heart failure: Secondary | ICD-10-CM

## 2021-04-10 DIAGNOSIS — I878 Other specified disorders of veins: Secondary | ICD-10-CM | POA: Diagnosis not present

## 2021-04-10 NOTE — Patient Instructions (Signed)
Medication Instructions:  Your physician recommends that you continue on your current medications as directed. Please refer to the Current Medication list given to you today.  *If you need a refill on your cardiac medications before your next appointment, please call your pharmacy*   Lab Work: TODAY: BMET, CBC, Mg, BNP  If you have labs (blood work) drawn today and your tests are completely normal, you will receive your results only by: MyChart Message (if you have MyChart) OR A paper copy in the mail If you have any lab test that is abnormal or we need to change your treatment, we will call you to review the results.    Follow-Up: At [facility], you and your health needs are our priority.  As part of our continuing mission to provide you with exceptional heart care, we have created designated Provider Care Teams.  These Care Teams include your primary Cardiologist (physician) and Advanced Practice Providers (APPs -  Physician Assistants and Nurse Practitioners) who all work together to provide you with the care you need, when you need it.   Your next appointment:   05/08/2021 with Otilio Saber, PA-C

## 2021-04-10 NOTE — Telephone Encounter (Signed)
Pt is overdue for follow up.     Pt changed to torsemide at last OV with Dr. Lalla Brothers.  Pt thinks she has cellulitis.  Encouraged Pt to come for office visit today for reevaluation.    Pt states she will come for visit.

## 2021-04-10 NOTE — Progress Notes (Signed)
PCP:  Sheliah Hatch, PA-C Primary Cardiologist: None Electrophysiologist: Lanier Prude, MD   Brianna Erickson is a 55 y.o. female seen today for Lanier Prude, MD for acute visit due to multiple complaints  She was seen by Dr. Lalla Brothers in may and switched from lasix to torsemide. She did have relief at the beginning. Reports med compliance. She states she called her PCP Friday and was instructed to go to the ED based on complaints of possible cellulitis and leg swelling. There is also ? Of unclear weight loss.   Today, she presents with multiple complaints, cardiac and not. Pt complains of imbalance for > a year leading to intermittent falls and fear of driving, multiple plantar warts, chronic intermittent edema with intermittent lesions and weeping sores, and Anxiety. She sleep chronically on 2 pillows with the HOB elevated as well. Denies palpitation or chest pain. SOB with more than ADLs. No syncope.   Past Medical History:  Diagnosis Date   Alcohol use    Depression with anxiety    Past Surgical History:  Procedure Laterality Date   CARDIOVERSION N/A 09/08/2020   Procedure: CARDIOVERSION;  Surgeon: Jake Bathe, MD;  Location: [facility] ENDOSCOPY;  Service: Cardiovascular;  Laterality: N/A;   CHOLECYSTECTOMY     GASTRIC BYPASS      Current Outpatient Medications  Medication Sig Dispense Refill   albuterol (VENTOLIN HFA) 108 (90 Base) MCG/ACT inhaler Inhale 2 puffs into the lungs every 6 (six) hours as needed for wheezing or shortness of breath.     ALPRAZolam (XANAX) 0.5 MG tablet Take 0.5 mg by mouth 3 (three) times daily as needed for anxiety.      amitriptyline (ELAVIL) 25 MG tablet Take 25 mg by mouth at bedtime as needed for sleep.     Cyanocobalamin (VITAMIN B12 PO) Take 3,000 mcg by mouth daily. Chews gummy     escitalopram (LEXAPRO) 20 MG tablet Take 20 mg by mouth daily.     metoprolol tartrate (LOPRESSOR) 50 MG tablet Take 1 tablet (50 mg total) by mouth 2 (two)  times daily. 60 tablet 0   Multiple Vitamin (MULTIVITAMIN ADULT PO) Take 1 capsule by mouth daily. Chews one gummy by mouth daily Woman's     nicotine (NICODERM CQ - DOSED IN MG/24 HOURS) 21 mg/24hr patch Place 1 patch (21 mg total) onto the skin daily. 28 patch 0   pantoprazole (PROTONIX) 40 MG tablet Take 1 tablet (40 mg total) by mouth daily. 30 tablet 0   potassium chloride SA (KLOR-CON) 20 MEQ tablet Take 1 tablet (20 mEq total) by mouth 2 (two) times daily. 30 tablet 0   rivaroxaban (XARELTO) 20 MG TABS tablet Take 1 tablet (20 mg total) by mouth daily with supper. 30 tablet 2   torsemide (DEMADEX) 20 MG tablet Take 2 tablets (40 mg total) by mouth 2 (two) times daily. 360 tablet 3   VRAYLAR capsule Take 1.5 mg by mouth daily.     No current facility-administered medications for this visit.    No Known Allergies  Social History   Socioeconomic History   Marital status: Divorced    Spouse name: Not on file   Number of children: Not on file   Years of education: Not on file   Highest education level: Not on file  Occupational History   Not on file  Tobacco Use   Smoking status: Every Day    Types: Cigarettes   Smokeless tobacco: Former   Tobacco  comments:    10 cigarettes daily  Vaping Use   Vaping Use: Never used  Substance and Sexual Activity   Alcohol use: Yes    Alcohol/week: 6.0 standard drinks    Types: 6 Cans of beer per week    Comment: ocassional   Drug use: Not Currently    Types: Marijuana   Sexual activity: Not Currently    Partners: Male  Other Topics Concern   Not on file  Social History Narrative   Not on file   Social Determinants of Health   Financial Resource Strain: Not on file  Food Insecurity: Not on file  Transportation Needs: Not on file  Physical Activity: Not on file  Stress: Not on file  Social Connections: Not on file  Intimate Partner Violence: Not on file     Review of Systems: All other systems reviewed and are otherwise  negative except as noted above.  Physical Exam: Vitals:   04/10/21 1108  BP: 100/60  Pulse: 68  SpO2: 94%  Weight: (!) 378 lb (171.5 kg)  Height: 5\' 11"  (1.803 m)   Wt Readings from Last 3 Encounters:  04/10/21 (!) 378 lb (171.5 kg)  01/03/21 (!) 384 lb (174.2 kg)  09/23/20 (!) 372 lb 6.4 oz (168.9 kg)     GEN- The patient is anxious appearing, alert and oriented x 3 today.   HEENT: normocephalic, atraumatic; sclera clear, conjunctiva pink; hearing intact; oropharynx clear; neck supple, no JVP Lymph- no cervical lymphadenopathy Lungs- Clear to ausculation bilaterally, normal work of breathing.  No wheezes, rales, rhonchi Heart- Regular rate and rhythm, no murmurs, rubs or gallops, PMI not laterally displaced GI- soft, non-tender, non-distended, bowel sounds present, no hepatosplenomegaly Extremities- no clubbing or cyanosis. Chronic appearing woody type edema up into her thighs with chronic venous stasis changes.  Several small excoriation type lesions on BLE, R>L. At least 1 with clear serous drainage. MS- no significant deformity or atrophy Skin- warm and dry, no rash or lesion Psych- euthymic mood, full affect Neuro- strength and sensation are intact  EKG is ordered. Personal review of EKG from today shows NSR at 68 bpm, normal intervals  Additional studies reviewed include: Previous EP office notes.   Assessment and Plan:  1. Acute on chronic diastolic CHF Volume status confounded by obesity and chronic venous stasis, but appears at least moderately overloaded with woody edema into thighs NYHA II-III symptoms  2. Typical atrial flutter EKG shows NSR Continue xarelto for CHA2DS2-VASc of at least 2  3. Chronic venous stasis changes She has a singular lesion with serous drainage. No obvious purulence. With no fever, chills, or obvious purulence, I do not feel she is urgently indicated for ABX, especially with  recent med changes and no labwork in the system since  09/08/20 She has a history of pill dysphagia which may further complicate ABX use, especially if doxycycline is considered.  I have recommended prompt follow up with PCP, or in lieu of that, to seek urgent care or care in the emergency room as previously recommended.  Encoruaged med compliance  Her diastolic CHF is contributing, but she has multiple non-cardiac complaints today complicated further by non-compliance with regular follow up.  I will make further recommendations on her diuretic regimen once both her renal function BNP result.  Ultimately, I think she may need RHC to help clarify her volume status given body habitus, depending on lab results. ? If could have component of pulmonary HTN / R sided HF. Her  last echo showed normal RV size and function but poor TR signal and we were note able to estimate PA pressure.  Graciella Freer, PA-C  04/10/21 11:28 AM

## 2021-04-10 NOTE — Telephone Encounter (Signed)
Pt c/o swelling: STAT is pt has developed SOB within 24 hours  How much weight have you gained and in what time span? Pt lost 18lbs over the weekend  If swelling, where is the swelling located?  Ankle to mid calf, hot to touch, oozing fluid, Pt self diagnosed herself with [employer] and she feels like she has cellulitis  Are you currently taking a fluid pill? Yes, pt was asked to double up on fluid pill  Are you currently SOB? No  Do you have a log of your daily weights (if so, list)? No pt does not weigh herself everyday  Have you gained 3 pounds in a day or 5 pounds in a week?  Pt lost 18lbs over the weekend  Have you traveled recently? No, pt does not drive. Pt was advised to go the ED on Friday but she refused to go because she doesn't want to overreact.

## 2021-04-11 ENCOUNTER — Telehealth: Payer: Self-pay | Admitting: Nurse Practitioner

## 2021-04-11 DIAGNOSIS — I5032 Chronic diastolic (congestive) heart failure: Secondary | ICD-10-CM

## 2021-04-11 DIAGNOSIS — E876 Hypokalemia: Secondary | ICD-10-CM

## 2021-04-11 LAB — CBC
Hematocrit: 37.2 % (ref 34.0–46.6)
Hemoglobin: 12 g/dL (ref 11.1–15.9)
MCH: 28.2 pg (ref 26.6–33.0)
MCHC: 32.3 g/dL (ref 31.5–35.7)
MCV: 87 fL (ref 79–97)
Platelets: 314 10*3/uL (ref 150–450)
RBC: 4.26 x10E6/uL (ref 3.77–5.28)
RDW: 15.8 % — ABNORMAL HIGH (ref 11.7–15.4)
WBC: 10.8 10*3/uL (ref 3.4–10.8)

## 2021-04-11 LAB — MAGNESIUM: Magnesium: 1.4 mg/dL — ABNORMAL LOW (ref 1.6–2.3)

## 2021-04-11 LAB — BASIC METABOLIC PANEL
BUN/Creatinine Ratio: 14 (ref 9–23)
BUN: 10 mg/dL (ref 6–24)
CO2: 32 mmol/L — ABNORMAL HIGH (ref 20–29)
Calcium: 9.2 mg/dL (ref 8.7–10.2)
Chloride: 89 mmol/L — ABNORMAL LOW (ref 96–106)
Creatinine, Ser: 0.7 mg/dL (ref 0.57–1.00)
Glucose: 115 mg/dL — ABNORMAL HIGH (ref 65–99)
Potassium: 3.2 mmol/L — ABNORMAL LOW (ref 3.5–5.2)
Sodium: 135 mmol/L (ref 134–144)
eGFR: 102 mL/min/{1.73_m2} (ref >59)

## 2021-04-11 LAB — PRO B NATRIURETIC PEPTIDE: NT-Pro BNP: 600 pg/mL — ABNORMAL HIGH (ref 0–287)

## 2021-04-11 MED ORDER — MAGNESIUM OXIDE 400 MG PO CAPS: 400.0000 mg | ORAL_CAPSULE | Freq: Every day | ORAL | 0 refills | Status: DC

## 2021-04-11 MED ORDER — POTASSIUM CHLORIDE CRYS ER 20 MEQ PO TBCR: 40.0000 meq | EXTENDED_RELEASE_TABLET | Freq: Two times a day (BID) | ORAL | 11 refills | Status: AC

## 2021-04-11 NOTE — Telephone Encounter (Signed)
Please confirm how Brianna Erickson is taking potassium.    If truly taking 20 meq BID needs to double to 40 meq BID and recheck BMET end of week, ideally Thursday or stat Friday morning so can be re-addressed prior to weekend.   Need to add Mag Ox 400 mg daily.   WBC WNL not suggestive of infection. Needs to follow with PCP.   BNP pending.    Casimiro Needle "Otilio Saber, PA-C  Reviewed results and plan of care with patient. Brianna Erickson confirms that Brianna Erickson is taking potassium 20 mEq twice daily. Per her pharmacist, Brianna Erickson is crushing the medication and putting in jello. Brianna Erickson agrees to increase Kdur to 40 mEq twice daily. Brianna Erickson is scheduled for repeat bmet on 8/26 which has been ordered stat. I will refill her current Rx so that Brianna Erickson can get the same crushable tablets. Brianna Erickson is aware of follow-up appointment with Otilio Saber, PA on 9/19. I advised her to call back with additional questions or concerns and Brianna Erickson thanked me for the call.

## 2021-04-11 NOTE — Telephone Encounter (Signed)
-----   Message from Graciella Freer, PA-C sent at 04/11/2021  1:29 PM EDT ----- BNP borderline, and not convinced that increasing her diuretic will bring much improvement to her chronic / lymph type edema.  She was not centrally volume overloaded, and with electrolyte imbalance could cause more harm than good.   Please note recommendations below and encourage importance of close PCP follow up.

## 2021-04-13 ENCOUNTER — Telehealth: Payer: Self-pay | Admitting: *Deleted

## 2021-04-13 NOTE — Telephone Encounter (Signed)
New patient has an upcoming appointment on the 04/14/21( possible plantar warts). She wants to know how many days should she discontinue her blood thinners before visit,has not taken for one-two days already. Please advise.

## 2021-04-14 ENCOUNTER — Ambulatory Visit (INDEPENDENT_AMBULATORY_CARE_PROVIDER_SITE_OTHER): Payer: 59

## 2021-04-14 ENCOUNTER — Other Ambulatory Visit: Payer: Self-pay | Admitting: Podiatry

## 2021-04-14 ENCOUNTER — Other Ambulatory Visit: Payer: 59 | Admitting: *Deleted

## 2021-04-14 ENCOUNTER — Ambulatory Visit (INDEPENDENT_AMBULATORY_CARE_PROVIDER_SITE_OTHER): Payer: 59 | Admitting: Podiatry

## 2021-04-14 ENCOUNTER — Other Ambulatory Visit: Payer: Self-pay

## 2021-04-14 DIAGNOSIS — B070 Plantar wart: Secondary | ICD-10-CM

## 2021-04-14 DIAGNOSIS — E876 Hypokalemia: Secondary | ICD-10-CM

## 2021-04-14 DIAGNOSIS — I5032 Chronic diastolic (congestive) heart failure: Secondary | ICD-10-CM

## 2021-04-14 DIAGNOSIS — M79672 Pain in left foot: Secondary | ICD-10-CM

## 2021-04-14 DIAGNOSIS — M79671 Pain in right foot: Secondary | ICD-10-CM | POA: Diagnosis not present

## 2021-04-14 LAB — BASIC METABOLIC PANEL
BUN/Creatinine Ratio: 18 (ref 9–23)
BUN: 14 mg/dL (ref 6–24)
CO2: 36 mmol/L — ABNORMAL HIGH (ref 20–29)
Calcium: 9.2 mg/dL (ref 8.7–10.2)
Chloride: 94 mmol/L — ABNORMAL LOW (ref 96–106)
Creatinine, Ser: 0.79 mg/dL (ref 0.57–1.00)
Glucose: 106 mg/dL — ABNORMAL HIGH (ref 65–99)
Potassium: 3.9 mmol/L (ref 3.5–5.2)
Sodium: 136 mmol/L (ref 134–144)
eGFR: 88 mL/min/{1.73_m2} (ref >59)

## 2021-04-14 MED ORDER — FLUOROURACIL 5 % EX CREA: TOPICAL_CREAM | Freq: Two times a day (BID) | CUTANEOUS | 2 refills | Status: AC

## 2021-04-14 NOTE — Telephone Encounter (Signed)
Probably will not need to stop for my treatment

## 2021-04-14 NOTE — Progress Notes (Signed)
Subjective:   Patient ID: Brianna Erickson, female   DOB: 55 y.o.   MRN: [id number]   HPI Patient presents stating she is got several lesions in the bottom of her left foot that are difficult she is morbidly obese which makes everything difficult with severe venous disease and some cellulitis of the right lower leg she does smoke she does smoke marijuana also and is showing signs of depression at this time.  The lesions do get sore she tries to work on them herself   Review of Systems  All other systems reviewed and are negative.      Objective:  Physical Exam Vitals and nursing note reviewed.  Constitutional:      Appearance: She is well-developed.  Pulmonary:     Effort: Pulmonary effort is normal.  Musculoskeletal:        General: Normal range of motion.  Skin:    General: Skin is warm.  Neurological:     Mental Status: She is alert.    Neurovascular status was intact significant venous disease with swelling around the ankle and foot bilateral with slight breakdown right lower leg that is being treated with antibiotics and appears stable.  On the bottom of the left foot there is a spot underneath the left first metatarsal and left heel measuring about 10 x 10 mm that are localized with mild discomfort and she has been using medicated corn pads.  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  Patient will be seen back if needed I would like not to have to cut these out as I am not sure about healing for her and I did explain elevation to try to help with the swelling she gets in her legs.  It is difficult as she does have a lot of depression have encouraged  her to go back to her family physician and also encouraged her to look at a wellness center where she may be able to start swimming which I think would be of benefit to her  X-rays indicate significant flatfoot deformity secondary to stress on her arch with spur formation no indications advanced arthritis calcification

## 2021-04-14 NOTE — Telephone Encounter (Signed)
Returned the call to patient, gave information per Dr Charlsie Merles, verbalized understanding and said that she had received permission to go off blood thinner for a couple of days after speaking with PCP yesterday after calling our office.

## 2021-05-08 ENCOUNTER — Ambulatory Visit: Payer: 59 | Admitting: Student

## 2021-07-30 ENCOUNTER — Other Ambulatory Visit: Payer: Self-pay

## 2021-07-30 ENCOUNTER — Emergency Department (HOSPITAL_BASED_OUTPATIENT_CLINIC_OR_DEPARTMENT_OTHER)
Admission: EM | Admit: 2021-07-30 | Discharge: 2021-07-30 | Disposition: A | Discharge status: Home / Self Care | Payer: 59 | Attending: Emergency Medicine | Admitting: Emergency Medicine

## 2021-07-30 ENCOUNTER — Emergency Department (HOSPITAL_BASED_OUTPATIENT_CLINIC_OR_DEPARTMENT_OTHER): Payer: 59

## 2021-07-30 DIAGNOSIS — I4892 Unspecified atrial flutter: Secondary | ICD-10-CM | POA: Diagnosis not present

## 2021-07-30 DIAGNOSIS — Z87891 Personal history of nicotine dependence: Secondary | ICD-10-CM | POA: Diagnosis not present

## 2021-07-30 DIAGNOSIS — I509 Heart failure, unspecified: Secondary | ICD-10-CM | POA: Diagnosis not present

## 2021-07-30 DIAGNOSIS — M7989 Other specified soft tissue disorders: Secondary | ICD-10-CM | POA: Insufficient documentation

## 2021-07-30 DIAGNOSIS — M79605 Pain in left leg: Secondary | ICD-10-CM

## 2021-07-30 DIAGNOSIS — Z7901 Long term (current) use of anticoagulants: Secondary | ICD-10-CM | POA: Diagnosis not present

## 2021-07-30 LAB — BASIC METABOLIC PANEL
Anion gap: 10 (ref 5–15)
BUN: 27 mg/dL — ABNORMAL HIGH (ref 6–20)
CO2: 26 mmol/L (ref 22–32)
Calcium: 9.7 mg/dL (ref 8.9–10.3)
Chloride: 94 mmol/L — ABNORMAL LOW (ref 98–111)
Creatinine, Ser: 0.87 mg/dL (ref 0.44–1.00)
GFR, Estimated: >60 mL/min (ref >60)
Glucose, Bld: 98 mg/dL (ref 70–99)
Potassium: 3.7 mmol/L (ref 3.5–5.1)
Sodium: 130 mmol/L — ABNORMAL LOW (ref 135–145)

## 2021-07-30 LAB — PROTIME-INR
INR: 2.2 — ABNORMAL HIGH (ref 0.8–1.2)
Prothrombin Time: 24.5 seconds — ABNORMAL HIGH (ref 11.4–15.2)

## 2021-07-30 LAB — CBC
HCT: 40.2 % (ref 36.0–46.0)
Hemoglobin: 13 g/dL (ref 12.0–15.0)
MCH: 28 pg (ref 26.0–34.0)
MCHC: 32.3 g/dL (ref 30.0–36.0)
MCV: 86.5 fL (ref 80.0–100.0)
Platelets: 279 10*3/uL (ref 150–400)
RBC: 4.65 MIL/uL (ref 3.87–5.11)
RDW: 17.6 % — ABNORMAL HIGH (ref 11.5–15.5)
WBC: 11.1 10*3/uL — ABNORMAL HIGH (ref 4.0–10.5)
nRBC: 0 % (ref 0.0–0.2)

## 2021-07-30 MED ORDER — MUPIROCIN CALCIUM 2 % EX CREA: 1.0000 "application " | TOPICAL_CREAM | Freq: Two times a day (BID) | CUTANEOUS | 0 refills | Status: AC

## 2021-07-30 NOTE — ED Triage Notes (Signed)
Pt states left lower leg pain for past 3 hours, hx poor circulation, area red and warm to touch.  Also feels like fluttering in her chest on and off.

## 2021-07-30 NOTE — ED Notes (Signed)
Patient transported to X-ray 

## 2021-07-30 NOTE — ED Provider Notes (Signed)
Emergency Department Provider Note   I have reviewed the triage vital signs and the nursing notes.   HISTORY  Chief Complaint Leg Pain   HPI Brianna Erickson is a 55 y.o. female with a past medical history of a flutter on Xarelto, hyperlipidemia, and chronic swelling in the lower legs presents to the emergency department with acute onset swelling and redness in the left foot.  She describes an associated throbbing pain.  She tells me that at baseline she has blue discoloration to both feet including toes but was at home, sitting in a chair, when she suddenly noticed severe swelling.  She states there was swelling to the point of weeping from the skin and throbbing discomfort.  She tells me that she took a second Xarelto for the day thinking of possible blood clot but was not having pain into the calf or behind the knee.  She is not having chest pain or pressure.  She did feel some palpitations.  No shortness of breath.  No fevers.  Past Medical History:  Diagnosis Date   Alcohol use    CHF (congestive heart failure) (HCC)    Depression with anxiety     Patient Active Problem List   Diagnosis Date Noted   Secondary hypercoagulable state (HCC) 08/24/2020   Acute dermatitis 07/21/2020   Hyperlipidemia 07/21/2020   Typical atrial flutter (HCC) 07/20/2020   Alcohol use    Depression with anxiety    Morbid obesity (HCC) 08/03/2019   Gastroesophageal reflux disease without esophagitis 08/03/2019   Hyperglycemia 08/03/2019   Mild benzodiazepine use disorder (HCC) 08/03/2019   Mild tetrahydrocannabinol (THC) abuse 08/03/2019   Passive suicidal ideations 08/03/2019   Severe episode of recurrent major depressive disorder, without psychotic features (HCC) 08/03/2019   Urine findings abnormal 08/03/2019   GAD (generalized anxiety disorder) 08/03/2019   Alcohol use disorder, severe, dependence (HCC) 08/02/2019    Past Surgical History:  Procedure Laterality Date   CARDIOVERSION N/A  09/08/2020   Procedure: CARDIOVERSION;  Surgeon: Jake Bathe, MD;  Location: [facility];  Service: Cardiovascular;  Laterality: N/A;   CHOLECYSTECTOMY     GASTRIC BYPASS      Allergies Patient has no known allergies.  Family History  Problem Relation Age of Onset   Heart disease Father     Social History Social History   Tobacco Use   Smoking status: Former    Types: Cigarettes   Smokeless tobacco: Former   Tobacco comments:    10 cigarettes daily  Vaping Use   Vaping Use: Never used  Substance Use Topics   Alcohol use: Yes    Alcohol/week: 6.0 standard drinks    Types: 6 Cans of beer per week    Comment: ocassional   Drug use: Not Currently    Types: Marijuana    Review of Systems  Constitutional: No fever/chills Eyes: No visual changes. ENT: No sore throat. Cardiovascular: Denies chest pain. Positive palpitations.  Respiratory: Denies shortness of breath. Gastrointestinal: No abdominal pain.  No nausea, no vomiting.  No diarrhea.  No constipation. Genitourinary: Negative for dysuria. Musculoskeletal: Positive left foot pain/swelling.  Skin: Negative for rash. Neurological: Negative for headaches, focal weakness or numbness.  10-point ROS otherwise negative.  ____________________________________________   PHYSICAL EXAM:  VITAL SIGNS: ED Triage Vitals  Enc Vitals Group     BP 07/30/21 1610 (!) 104/56     Pulse Rate 07/30/21 1610 68     Resp 07/30/21 1610 18  Temp 07/30/21 1610 98.7 F (37.1 C)     Temp src --      SpO2 07/30/21 1610 99 %     Weight 07/30/21 1607 (!) 355 lb (161 kg)     Height 07/30/21 1607 5\' 11"  (1.803 m)    Constitutional: Alert and oriented. Well appearing and in no acute distress. Eyes: Conjunctivae are normal.  Head: Atraumatic. Nose: No congestion/rhinnorhea. Mouth/Throat: Mucous membranes are moist.  Neck: No stridor.   Cardiovascular: Normal rate, regular rhythm. Grossly normal heart sounds.  Patient with  cyanosis to the bilateral toes (baseline) with palpable DP pulses bilaterally and normal sensation.  Respiratory: Normal respiratory effort.  No retractions. Lungs CTAB. Gastrointestinal: Soft and nontender. No distention.  Musculoskeletal: No lower extremity tenderness nor edema. No gross deformities of extremities. Neurologic:  Normal speech and language. No gross focal neurologic deficits are appreciated.  Skin:  Skin is warm and dry.  Bilateral leg swelling.  To the left foot is erythematous over the dorsal aspect with some mild swelling.  Palpable pulses noted.  Some cyanosis to the toes on the right (reported to be baseline and without pain).   ____________________________________________   LABS (all labs ordered are listed, but only abnormal results are displayed)  Labs Reviewed  BASIC METABOLIC PANEL - Abnormal; Notable for the following components:      Result Value   Sodium 130 (*)    Chloride 94 (*)    BUN 27 (*)    All other components within normal limits  CBC - Abnormal; Notable for the following components:   WBC 11.1 (*)    RDW 17.6 (*)    All other components within normal limits  PROTIME-INR - Abnormal; Notable for the following components:   Prothrombin Time 24.5 (*)    INR 2.2 (*)    All other components within normal limits   ____________________________________________  EKG   EKG Interpretation  Date/Time:  Sunday July 30 2021 16:17:35 EST Ventricular Rate:  69 PR Interval:  250 QRS Duration: 100 QT Interval:  383 QTC Calculation: 411 R Axis:   59 Text Interpretation: Sinus rhythm Prolonged PR interval Low voltage, precordial leads Confirmed by 03-07-1981 [phone]) on 07/30/2021 4:24:20 PM        ____________________________________________  RADIOLOGY  DG Chest 2 View  Result Date: 07/30/2021 CLINICAL DATA:  Palpitations, history of congestive heart failure EXAM: CHEST - 2 VIEW COMPARISON:  07/20/2020 FINDINGS: Single frontal view of the  chest demonstrates a stable cardiac silhouette. No acute airspace disease, effusion, or pneumothorax. No acute bony abnormalities. IMPRESSION: 1. No acute intrathoracic process. Electronically Signed   By: 14/08/2019 M.D.   On: 07/30/2021 16:35    ____________________________________________   PROCEDURES  Procedure(s) performed:   Procedures  None ____________________________________________   INITIAL IMPRESSION / ASSESSMENT AND PLAN / ED COURSE  Pertinent labs & imaging results that were available during my care of the patient were reviewed by me and considered in my medical decision making (see chart for details).   Patient presents the emergency department with acute onset pain with swelling and redness to the top of the left foot.  I do not see any bite or skin breakdown.  She has palpable pulses and sensation in both feet.  She have cyanosis in the toes on the right foot but reports this is baseline.  She states she discussed this with her cardiologist and foot doctor in the past but does not follow with a vascular specialist.  She is anticoagulated and compliant with this medication.   Hyponatremia noted on labs but no symptoms. Can defer workup to outpatient setting. No DVT. CXR is clear. No CAP or edema. Palpable pulse but cyanosis in the toes of both feet. She would benefit from vascular surgery follow up. Discussed this with the patient and provided contact information for the vascular specialist on call. She will coordinate with her PCP as well. Mild redness to the top of the left foot. Will place the patient on topical abx and have her f/u in 2-3 days with her PCP for wound check or return to the ED if symptoms worsen.    ____________________________________________  FINAL CLINICAL IMPRESSION(S) / ED DIAGNOSES  Final diagnoses:  Left leg pain     NEW OUTPATIENT MEDICATIONS STARTED DURING THIS VISIT:  Discharge Medication List as of 07/30/2021  6:12 PM     START  taking these medications   Details  mupirocin cream (BACTROBAN) 2 % Apply 1 application topically 2 (two) times daily for 7 days., Starting Sun 07/30/2021, Until Sun 08/06/2021, Normal        Note:  This document was prepared using Dragon voice recognition software and may include unintentional dictation errors.  Alona Bene, MD, [facility] Emergency Medicine    Khilee Hendricksen, Arlyss Repress, MD 08/03/21 [phone]

## 2021-07-30 NOTE — Discharge Instructions (Signed)
You are seen today with left foot swelling and redness.  I am starting on some topical antibiotics but your ultrasound and lab work here is reassuring.  Please call your primary care doctor for repeat blood work in the coming week and reevaluation.  Because of the chronic discoloration to the toes on both feet, I am sending you to see a vascular surgeon.  If you develop sudden pain or worsening discoloration you should return to the emergency department.

## 2021-08-02 ENCOUNTER — Other Ambulatory Visit: Payer: Self-pay

## 2021-08-02 ENCOUNTER — Ambulatory Visit (INDEPENDENT_AMBULATORY_CARE_PROVIDER_SITE_OTHER): Payer: 59 | Admitting: Vascular Surgery

## 2021-08-02 ENCOUNTER — Encounter: Payer: Self-pay | Admitting: Vascular Surgery

## 2021-08-02 VITALS — BP 116/76 | HR 64 | Temp 98.0°F | Resp 20 | Ht 71.0 in | Wt 359.0 lb

## 2021-08-02 DIAGNOSIS — I890 Lymphedema, not elsewhere classified: Secondary | ICD-10-CM

## 2021-08-02 DIAGNOSIS — I872 Venous insufficiency (chronic) (peripheral): Secondary | ICD-10-CM

## 2021-08-02 NOTE — Progress Notes (Signed)
Patient ID: Brianna Erickson, female   DOB: 1966-01-21, 55 y.o.   MRN: [id number]  Reason for Consult: New Patient (Initial Visit)   Referred by Sheliah Hatch, PA-C  Subjective:     HPI:  Brianna Erickson is a 55 y.o. female previous history of gastric bypass and she lost significant weight.  Unfortunately she developed alcoholism after this time regained much of her weight.  She now has significant edema of her bilateral lower extremities which causes significant purple discoloration.  More recently she had red discoloration of the left foot which led her to the emergency department.  She has not been able to get any compression stockings that fit her legs but did recently order some online that should arrive this week.  She is unable to exercise due to the heaviness of her bilateral lower extremities.  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  She does not have any tissue loss or ulceration at this time.  She is never had any venous arterial interventions of her bilateral lower extremities.  She states that the pain at times is 10 out of 10 due to the swelling of the left leg more than the right.  She is on diuretics which she does not notice any help for her legs.  She also takes anticoagulation for diagnosis of congestive heart failure.  Past Medical History:  Diagnosis Date   Alcohol use    CHF (congestive heart failure) (HCC)    Depression with anxiety    Family History  Problem Relation Age of Onset   Heart disease Father    Past Surgical History:  Procedure Laterality Date   CARDIOVERSION N/A 09/08/2020   Procedure: CARDIOVERSION;  Surgeon: Jake Bathe, MD;  Location: [facility];  Service: Cardiovascular;  Laterality: N/A;   CHOLECYSTECTOMY     GASTRIC BYPASS      Short Social History:  Social History   Tobacco Use   Smoking status: Former     Types: Cigarettes   Smokeless tobacco: Former   Tobacco comments:    10 cigarettes daily  Substance Use Topics   Alcohol use: Yes    Alcohol/week: 6.0 standard drinks    Types: 6 Cans of beer per week    Comment: ocassional    No Known Allergies  Current Outpatient Medications  Medication Sig Dispense Refill   albuterol (VENTOLIN HFA) 108 (90 Base) MCG/ACT inhaler Inhale 2 puffs into the lungs every 6 (six) hours as needed for wheezing or shortness of breath.     ALPRAZolam (XANAX) 0.5 MG tablet Take 0.5 mg by mouth 3 (three) times daily as needed for anxiety.      amitriptyline (ELAVIL) 25 MG tablet Take 25 mg by mouth at bedtime as needed for sleep.     Cyanocobalamin (VITAMIN B12 PO) Take 3,000 mcg by mouth daily. Chews gummy     escitalopram (LEXAPRO) 20 MG tablet Take 20 mg by mouth daily.     fluorouracil (EFUDEX) 5 % cream Apply topically 2 (two) times daily. 40 g 2   metoprolol tartrate (LOPRESSOR) 50 MG tablet Take 1 tablet (50 mg total) by mouth 2 (two) times daily. 60 tablet 0   Multiple Vitamin (MULTIVITAMIN ADULT PO) Take 1 capsule by mouth daily. Chews one gummy by mouth daily Woman's     mupirocin cream (BACTROBAN) 2 %  Apply 1 application topically 2 (two) times daily for 7 days. 14 g 0   pantoprazole (PROTONIX) 40 MG tablet Take 1 tablet (40 mg total) by mouth daily. 30 tablet 0   potassium chloride SA (KLOR-CON) 20 MEQ tablet Take 2 tablets (40 mEq total) by mouth 2 (two) times daily. 120 tablet 11   rivaroxaban (XARELTO) 20 MG TABS tablet Take 1 tablet (20 mg total) by mouth daily with supper. 30 tablet 2   torsemide (DEMADEX) 20 MG tablet Take 2 tablets (40 mg total) by mouth 2 (two) times daily. 360 tablet 3   VRAYLAR capsule Take 1.5 mg by mouth daily.     No current facility-administered medications for this visit.    Review of Systems  Constitutional:  Constitutional negative. HENT: HENT negative.  Eyes: Eyes negative.  Respiratory: Respiratory negative.   Cardiovascular: Positive for leg swelling.  GI: Gastrointestinal negative.  Musculoskeletal: Positive for leg pain.  Neurological: Neurological negative. Hematologic: Hematologic/lymphatic negative.  Psychiatric: Psychiatric negative.       Objective:  Objective   Vitals:   08/02/21 1350  BP: 116/76  Pulse: 64  Resp: 20  Temp: 98 F (36.7 C)  SpO2: 92%  Weight: (!) 359 lb (162.8 kg)  Height: 5\' 11"  (1.803 m)   Body mass index is 50.07 kg/m.  Physical Exam HENT:     Head: Normocephalic.     Nose:     Comments: Wearing a mask Eyes:     Pupils: Pupils are equal, round, and reactive to light.  Cardiovascular:     Pulses: Normal pulses.  Pulmonary:     Effort: Pulmonary effort is normal.  Abdominal:     General: Abdomen is flat.  Musculoskeletal:     Right lower leg: Edema present.     Left lower leg: Edema present.  Skin:    General: Skin is warm and dry.     Capillary Refill: Capillary refill takes less than 2 seconds.  Neurological:     General: No focal deficit present.     Mental Status: She is alert.  Psychiatric:        Mood and Affect: Mood normal.        Behavior: Behavior normal.        Thought Content: Thought content normal.        Judgment: Judgment normal.    Data: Recent lower extremity DVT study negative for DVT in the left lower extremity.     Assessment/Plan:     55 year old female sent for severe pain and swelling of the left greater than right lower extremity.  There is been no evidence of DVT and she does not have any history of DVT.  Appears that she has significant component of lymphedema.  I have recommended compression which she has ordered, elevation when possible and exercise.  I discussed the importance of water aerobics or water exercise with her giving her some comfort and ability to tolerate activity.  We will get her to follow-up in 4 weeks with lower extremity venous reflux study to rule out any underlying venous disease that  may contribute.  At that time we can discuss possible lymphedema pump as well.  All of this was communicated to the patient and she demonstrates good understanding     59 MD [facility]

## 2021-08-03 ENCOUNTER — Other Ambulatory Visit: Payer: Self-pay

## 2021-08-03 DIAGNOSIS — I872 Venous insufficiency (chronic) (peripheral): Secondary | ICD-10-CM

## 2021-08-07 ENCOUNTER — Ambulatory Visit (HOSPITAL_COMMUNITY): Admission: RE | Admit: 2021-08-07 | Payer: 59 | Source: Ambulatory Visit

## 2021-08-07 ENCOUNTER — Encounter (HOSPITAL_COMMUNITY): Payer: Self-pay

## 2021-08-10 ENCOUNTER — Telehealth (HOSPITAL_BASED_OUTPATIENT_CLINIC_OR_DEPARTMENT_OTHER): Payer: Self-pay

## 2021-08-30 ENCOUNTER — Encounter (HOSPITAL_BASED_OUTPATIENT_CLINIC_OR_DEPARTMENT_OTHER): Payer: 59

## 2021-08-30 DIAGNOSIS — Z1231 Encounter for screening mammogram for malignant neoplasm of breast: Secondary | ICD-10-CM

## 2021-09-06 ENCOUNTER — Ambulatory Visit: Payer: 59 | Admitting: Vascular Surgery

## 2021-09-19 ENCOUNTER — Encounter: Payer: Self-pay | Admitting: Podiatry

## 2021-10-05 ENCOUNTER — Ambulatory Visit: Payer: 59 | Admitting: Podiatry

## 2021-10-17 ENCOUNTER — Emergency Department (HOSPITAL_COMMUNITY): Payer: 59

## 2021-10-17 ENCOUNTER — Encounter (HOSPITAL_COMMUNITY): Payer: Self-pay

## 2021-10-17 ENCOUNTER — Other Ambulatory Visit: Payer: Self-pay

## 2021-10-17 ENCOUNTER — Emergency Department (HOSPITAL_COMMUNITY)
Admission: EM | Admit: 2021-10-17 | Discharge: 2021-10-17 | Disposition: A | Discharge status: Home / Self Care | Payer: 59 | Attending: Student | Admitting: Student

## 2021-10-17 DIAGNOSIS — R600 Localized edema: Secondary | ICD-10-CM | POA: Diagnosis not present

## 2021-10-17 DIAGNOSIS — Y92009 Unspecified place in unspecified non-institutional (private) residence as the place of occurrence of the external cause: Secondary | ICD-10-CM | POA: Insufficient documentation

## 2021-10-17 DIAGNOSIS — M25512 Pain in left shoulder: Secondary | ICD-10-CM | POA: Insufficient documentation

## 2021-10-17 DIAGNOSIS — I959 Hypotension, unspecified: Secondary | ICD-10-CM | POA: Insufficient documentation

## 2021-10-17 DIAGNOSIS — Z20822 Contact with and (suspected) exposure to covid-19: Secondary | ICD-10-CM | POA: Insufficient documentation

## 2021-10-17 DIAGNOSIS — Z79899 Other long term (current) drug therapy: Secondary | ICD-10-CM | POA: Insufficient documentation

## 2021-10-17 DIAGNOSIS — W19XXXA Unspecified fall, initial encounter: Secondary | ICD-10-CM | POA: Diagnosis not present

## 2021-10-17 DIAGNOSIS — S0990XA Unspecified injury of head, initial encounter: Secondary | ICD-10-CM | POA: Diagnosis present

## 2021-10-17 DIAGNOSIS — Z87891 Personal history of nicotine dependence: Secondary | ICD-10-CM | POA: Diagnosis not present

## 2021-10-17 DIAGNOSIS — H9202 Otalgia, left ear: Secondary | ICD-10-CM | POA: Diagnosis not present

## 2021-10-17 DIAGNOSIS — R748 Abnormal levels of other serum enzymes: Secondary | ICD-10-CM

## 2021-10-17 DIAGNOSIS — N3000 Acute cystitis without hematuria: Secondary | ICD-10-CM

## 2021-10-17 DIAGNOSIS — T1490XA Injury, unspecified, initial encounter: Secondary | ICD-10-CM

## 2021-10-17 DIAGNOSIS — N179 Acute kidney failure, unspecified: Secondary | ICD-10-CM

## 2021-10-17 DIAGNOSIS — I509 Heart failure, unspecified: Secondary | ICD-10-CM | POA: Insufficient documentation

## 2021-10-17 DIAGNOSIS — S1093XA Contusion of unspecified part of neck, initial encounter: Secondary | ICD-10-CM

## 2021-10-17 DIAGNOSIS — T50901A Poisoning by unspecified drugs, medicaments and biological substances, accidental (unintentional), initial encounter: Secondary | ICD-10-CM

## 2021-10-17 LAB — COMPREHENSIVE METABOLIC PANEL
ALT: 25 U/L (ref 0–44)
AST: 59 U/L — ABNORMAL HIGH (ref 15–41)
Albumin: 3 g/dL — ABNORMAL LOW (ref 3.5–5.0)
Alkaline Phosphatase: 85 U/L (ref 38–126)
Anion gap: 13 (ref 5–15)
BUN: 39 mg/dL — ABNORMAL HIGH (ref 6–20)
CO2: 24 mmol/L (ref 22–32)
Calcium: 8.5 mg/dL — ABNORMAL LOW (ref 8.9–10.3)
Chloride: 92 mmol/L — ABNORMAL LOW (ref 98–111)
Creatinine, Ser: 1.5 mg/dL — ABNORMAL HIGH (ref 0.44–1.00)
GFR, Estimated: 41 mL/min — ABNORMAL LOW (ref >60)
Glucose, Bld: 146 mg/dL — ABNORMAL HIGH (ref 70–99)
Potassium: 4.3 mmol/L (ref 3.5–5.1)
Sodium: 129 mmol/L — ABNORMAL LOW (ref 135–145)
Total Bilirubin: 0.4 mg/dL (ref 0.3–1.2)
Total Protein: 7.6 g/dL (ref 6.5–8.1)

## 2021-10-17 LAB — RAPID URINE DRUG SCREEN, HOSP PERFORMED
Amphetamines: NOT DETECTED
Barbiturates: NOT DETECTED
Benzodiazepines: POSITIVE — AB
Cocaine: POSITIVE — AB
Opiates: POSITIVE — AB
Tetrahydrocannabinol: NOT DETECTED

## 2021-10-17 LAB — CBC
HCT: 38.5 % (ref 36.0–46.0)
Hemoglobin: 12.2 g/dL (ref 12.0–15.0)
MCH: 27.7 pg (ref 26.0–34.0)
MCHC: 31.7 g/dL (ref 30.0–36.0)
MCV: 87.3 fL (ref 80.0–100.0)
Platelets: 319 10*3/uL (ref 150–400)
RBC: 4.41 MIL/uL (ref 3.87–5.11)
RDW: 17.9 % — ABNORMAL HIGH (ref 11.5–15.5)
WBC: 15 10*3/uL — ABNORMAL HIGH (ref 4.0–10.5)
nRBC: 0 % (ref 0.0–0.2)

## 2021-10-17 LAB — URINALYSIS, ROUTINE W REFLEX MICROSCOPIC
Bilirubin Urine: NEGATIVE
Glucose, UA: NEGATIVE mg/dL
Ketones, ur: NEGATIVE mg/dL
Nitrite: POSITIVE — AB
Protein, ur: NEGATIVE mg/dL
Specific Gravity, Urine: 1.029 (ref 1.005–1.030)
pH: 5 (ref 5.0–8.0)

## 2021-10-17 LAB — PROTIME-INR
INR: 1.1 (ref 0.8–1.2)
Prothrombin Time: 14.3 seconds (ref 11.4–15.2)

## 2021-10-17 LAB — I-STAT CHEM 8, ED
BUN: 39 mg/dL — ABNORMAL HIGH (ref 6–20)
Calcium, Ion: 1 mmol/L — ABNORMAL LOW (ref 1.15–1.40)
Chloride: 94 mmol/L — ABNORMAL LOW (ref 98–111)
Creatinine, Ser: 1.3 mg/dL — ABNORMAL HIGH (ref 0.44–1.00)
Glucose, Bld: 146 mg/dL — ABNORMAL HIGH (ref 70–99)
HCT: 42 % (ref 36.0–46.0)
Hemoglobin: 14.3 g/dL (ref 12.0–15.0)
Potassium: 4.3 mmol/L (ref 3.5–5.1)
Sodium: 130 mmol/L — ABNORMAL LOW (ref 135–145)
TCO2: 26 mmol/L (ref 22–32)

## 2021-10-17 LAB — RESP PANEL BY RT-PCR (FLU A&B, COVID) ARPGX2
Influenza A by PCR: NEGATIVE
Influenza B by PCR: NEGATIVE
SARS Coronavirus 2 by RT PCR: NEGATIVE

## 2021-10-17 LAB — LACTIC ACID, PLASMA
Lactic Acid, Venous: 2.7 mmol/L (ref 0.5–1.9)
Lactic Acid, Venous: 2 mmol/L (ref 0.5–1.9)

## 2021-10-17 LAB — CK: Total CK: 2900 U/L — ABNORMAL HIGH (ref 38–234)

## 2021-10-17 MED ORDER — CEPHALEXIN 500 MG PO CAPS: 500.0000 mg | ORAL_CAPSULE | Freq: Three times a day (TID) | ORAL | 0 refills | Status: AC

## 2021-10-17 MED ORDER — SODIUM CHLORIDE 0.9 % IV SOLN
1.0000 g | Freq: Once | INTRAVENOUS | Status: AC
  Administered 2021-10-17: 1 g via INTRAVENOUS
  Filled 2021-10-17: qty 10

## 2021-10-17 MED ORDER — IOHEXOL 350 MG/ML SOLN
100.0000 mL | Freq: Once | INTRAVENOUS | Status: AC | PRN
  Administered 2021-10-17: 100 mL via INTRAVENOUS

## 2021-10-17 MED ORDER — ONDANSETRON HCL 4 MG/2ML IJ SOLN
4.0000 mg | Freq: Once | INTRAMUSCULAR | Status: AC
  Administered 2021-10-17: 4 mg via INTRAVENOUS
  Filled 2021-10-17: qty 2

## 2021-10-17 MED ORDER — SODIUM CHLORIDE 0.9 % IV BOLUS
1000.0000 mL | Freq: Once | INTRAVENOUS | Status: AC
Start: 2021-10-17 — End: 2021-10-17
  Administered 2021-10-17: 1000 mL via INTRAVENOUS

## 2021-10-17 MED ORDER — NALOXONE HCL 2 MG/2ML IJ SOSY
2.0000 mg | PREFILLED_SYRINGE | INTRAMUSCULAR | Status: DC | PRN
  Administered 2021-10-17: 2 mg via INTRAVENOUS

## 2021-10-17 MED ORDER — OXYCODONE-ACETAMINOPHEN 5-325 MG PO TABS
2.0000 | ORAL_TABLET | Freq: Once | ORAL | Status: AC
  Administered 2021-10-17: 2 via ORAL
  Filled 2021-10-17: qty 2

## 2021-10-17 MED ORDER — ONDANSETRON HCL 4 MG/2ML IJ SOLN
4.0000 mg | Freq: Once | INTRAMUSCULAR | Status: AC
  Administered 2021-10-17: 4 mg via INTRAVENOUS

## 2021-10-17 NOTE — ED Notes (Signed)
Provided pt with chapstick and water

## 2021-10-17 NOTE — Progress Notes (Signed)
Orthopedic Tech Progress Note Patient Details:  Brianna Erickson 1966/01/08 [id number]  Level 2 trauma   Patient ID: Brianna Erickson, female   DOB: 02-08-1966, 56 y.o.   MRN: [id number]  Donald Pore 10/17/2021, 4:34 PM

## 2021-10-17 NOTE — ED Provider Notes (Signed)
[facility] Provider Note  CSN: [id number] Arrival date & time: 10/17/21 1418  Chief Complaint(s) No chief complaint on file.  HPI Patria ManeRosemary M Sanden is a 56 y.o. female with PMH CHF, bilateral lower extremity lymphedema, a flutter on Xarelto, HLD, alcohol use who presents emergency department as a level 2 trauma for fall.  Per EMS, patient was found in a chair in her house, unsure who called EMS.  The patient states that she does not remember 6 hours of the day but is having left ear pain and left shoulder pain.  EMS noticed bruising behind the ear and bruising to the shoulder and found the patient to be hypotensive in the 70s.  Patient arrives alert and oriented answering questions appropriately but minimally diaphoretic.  She is unsure about the events of today and is unsure if she fell.  Denies chest pain, shortness of breath, abdominal pain, nausea, vomiting or other systemic symptoms.  HPI  Past Medical History Past Medical History:  Diagnosis Date   Alcohol use    CHF (congestive heart failure) (HCC)    Depression with anxiety    Patient Active Problem List   Diagnosis Date Noted   Secondary hypercoagulable state (HCC) 08/24/2020   Acute dermatitis 07/21/2020   Hyperlipidemia 07/21/2020   Typical atrial flutter (HCC) 07/20/2020   Alcohol use    Depression with anxiety    Morbid obesity (HCC) 08/03/2019   Gastroesophageal reflux disease without esophagitis 08/03/2019   Hyperglycemia 08/03/2019   Mild benzodiazepine use disorder (HCC) 08/03/2019   Mild tetrahydrocannabinol (THC) abuse 08/03/2019   Passive suicidal ideations 08/03/2019   Severe episode of recurrent major depressive disorder, without psychotic features (HCC) 08/03/2019   Urine findings abnormal 08/03/2019   GAD (generalized anxiety disorder) 08/03/2019   Alcohol use disorder, severe, dependence (HCC) 08/02/2019   Home Medication(s) Prior to Admission medications    Medication Sig Start Date End Date Taking? Authorizing Provider  albuterol (VENTOLIN HFA) 108 (90 Base) MCG/ACT inhaler Inhale 2 puffs into the lungs every 6 (six) hours as needed for wheezing or shortness of breath. 08/11/20   [provider]  ALPRAZolam Prudy Feeler(XANAX) 0.5 MG tablet Take 0.5 mg by mouth 3 (three) times daily as needed for anxiety.  06/28/20   [provider]  amitriptyline (ELAVIL) 25 MG tablet Take 25 mg by mouth at bedtime as needed for sleep. 06/18/20   [provider]  Cyanocobalamin (VITAMIN B12 PO) Take 3,000 mcg by mouth daily. Chews gummy    [provider]  escitalopram (LEXAPRO) 20 MG tablet Take 20 mg by mouth daily. 07/19/20   [provider]  fluorouracil (EFUDEX) 5 % cream Apply topically 2 (two) times daily. 04/14/21   Lenn Sinkegal, Norman S, DPM  metoprolol tartrate (LOPRESSOR) 50 MG tablet Take 1 tablet (50 mg total) by mouth 2 (two) times daily. 07/26/20   Rolly SalterPatel, Pranav M, MD  Multiple Vitamin (MULTIVITAMIN ADULT PO) Take 1 capsule by mouth daily. Chews one gummy by mouth daily Woman's    [provider]  pantoprazole (PROTONIX) 40 MG tablet Take 1 tablet (40 mg total) by mouth daily. 07/26/20   Rolly SalterPatel, Pranav M, MD  potassium chloride SA (KLOR-CON) 20 MEQ tablet Take 2 tablets (40 mEq total) by mouth 2 (two) times daily. 04/11/21   Graciella Freerillery, Michael Andrew, PA-C  rivaroxaban (XARELTO) 20 MG TABS tablet Take 1 tablet (20 mg total) by mouth daily with supper. 12/13/20   Fenton, Clint R, PA  torsemide (  DEMADEX) 20 MG tablet Take 2 tablets (40 mg total) by mouth 2 (two) times daily. 01/03/21   Lanier Prude, MD  VRAYLAR capsule Take 1.5 mg by mouth daily. 06/18/20   [provider]                                                                                                                                    Past Surgical History Past Surgical History:  Procedure Laterality Date   CARDIOVERSION N/A 09/08/2020    Procedure: CARDIOVERSION;  Surgeon: Jake Bathe, MD;  [facility]: [facility] ENDOSCOPY;  Service: Cardiovascular;  Laterality: N/A;   CHOLECYSTECTOMY     GASTRIC BYPASS     Family History Family History  Problem Relation Age of Onset   Heart disease Father     Social History Social History   Tobacco Use   Smoking status: Former    Types: Cigarettes   Smokeless tobacco: Former   Tobacco comments:    10 cigarettes daily  Vaping Use   Vaping Use: Never used  Substance Use Topics   Alcohol use: Yes    Alcohol/week: 6.0 standard drinks    Types: 6 Cans of beer per week    Comment: ocassional   Drug use: Not Currently    Types: Marijuana   Allergies Patient has no known allergies.  Review of Systems Review of Systems  Constitutional:  Positive for fatigue.  HENT:  Positive for ear pain.   Gastrointestinal:  Positive for nausea.   Physical Exam Vital Signs  I have reviewed the triage vital signs There were no vitals taken for this visit.  Physical Exam Vitals and nursing note reviewed.  Constitutional:      General: She is not in acute distress.    Appearance: She is well-developed. She is ill-appearing and diaphoretic.  HENT:     Head: Normocephalic and atraumatic.  Eyes:     Conjunctiva/sclera: Conjunctivae normal.  Cardiovascular:     Rate and Rhythm: Normal rate and regular rhythm.     Heart sounds: No murmur heard. Pulmonary:     Effort: Pulmonary effort is normal. No respiratory distress.     Breath sounds: Normal breath sounds.  Abdominal:     Palpations: Abdomen is soft.     Tenderness: There is no abdominal tenderness.  Musculoskeletal:        General: Tenderness (Shoulder) present. No swelling.     Cervical back: Neck supple.     Right lower leg: Edema present.     Left lower leg: Edema present.  Skin:    General: Skin is warm.     Capillary Refill: Capillary refill takes less than 2 seconds.  Neurological:     Mental Status: She is alert.   Psychiatric:        Mood and Affect: Mood normal.    ED Results and Treatments Labs (all labs ordered are listed, but only  abnormal results are displayed) Labs Reviewed - No data to display                                                                                                                        Radiology No results found.  Pertinent labs & imaging results that were available during my care of the patient were reviewed by me and considered in my medical decision making (see MDM for details).  Medications Ordered in ED Medications - No data to display                                                                                                                                   Procedures .Critical Care Performed by: Glendora Score, MD Authorized by: Glendora Score, MD   Critical care provider statement:    Critical care time (minutes):  30   Critical care was necessary to treat or prevent imminent or life-threatening deterioration of the following conditions:  Trauma   Critical care was time spent personally by me on the following activities:  Development of treatment plan with patient or surrogate, discussions with consultants, evaluation of patient's response to treatment, examination of patient, ordering and review of laboratory studies, ordering and review of radiographic studies, ordering and performing treatments and interventions, pulse oximetry, re-evaluation of patient's condition and review of old charts  (including critical care time)  Medical Decision Making / ED Course   This patient presents to the ED for concern of hypotension, trauma, this involves an extensive number of treatment options, and is a complaint that carries with it a high risk of complications and morbidity.  The differential diagnosis includes temporal bone fracture, aortic dissection, intra-abdominal hemorrhage, shoulder injury, polysubstance use  MDM: Patient seen emergency  department for evaluation of a fall with visible head trauma, hypotension and diaphoresis.  On initial presentation, patient alert and oriented but with persistent hypotension in the 70s and 80s, mild diaphoresis.  With minimal history and physical at exam evidence of what appears to be a positive battle sign behind the left ear, patient was taken for emergent trauma imaging including CT head, CT temporal bone, CT C-spine, CT angio neck, CT dissection study which are all reassuringly negative.  While obtaining this trauma imaging, patient's blood pressure was stabilized initially with a liter of fluid but she became more hypotensive and thus Levophed was started.  While in the CAT scan  are, I received collateral information from the patient's sister who states that she discovered that the patient has been using multiple recreational substances including cocaine and heroin.  Upon return to the patient's room she was given 2 mg of IV Narcan and had a dramatic reversal response with diaphoresis, vomiting and yawning.  Her blood pressure then immediately improved.  The patient will require a period of observation to return to normal mental status baseline.  Patient then signed out to oncoming provider for metabolization of substances.   Additional history obtained: -Additional history obtained from multiple family members -External records from outside source obtained and reviewed including: Chart review including previous notes, labs, imaging, consultation notes   Lab Tests: -I ordered, reviewed, and interpreted labs.   The pertinent results include:   Labs Reviewed - No data to display    EKG   EKG Interpretation  Date/Time:  Tuesday October 17 2021 15:53:05 EST Ventricular Rate:  56 PR Interval:  195 QRS Duration: 90 QT Interval:  463 QTC Calculation: 447 R Axis:   61 Text Interpretation: Sinus rhythm Minimal ST elevation, inferior leads Confirmed by Yassen Kinnett ([id number]) on 10/17/2021 5:16:55  PM         Imaging Studies ordered: I ordered imaging studies including CT head, C-spine, neck, aortogram I independently visualized and interpreted imaging. I agree with the radiologist interpretation   Medicines ordered and prescription drug management: No orders of the defined types were placed in this encounter.   -I have reviewed the patients home medicines and have made adjustments as needed  Critical interventions Fluid resuscitation, pressor support, drug reversal  Cardiac Monitoring: The patient was maintained on a cardiac monitor.  I personally viewed and interpreted the cardiac monitored which showed an underlying rhythm of: Sinus bradycardia  Social Determinants of Health:  Factors impacting patients care include: Polysubstance use   Reevaluation: After the interventions noted above, I reevaluated the patient and found that they have :improved  Co morbidities that complicate the patient evaluation  Past Medical History:  Diagnosis Date   Alcohol use    CHF (congestive heart failure) (HCC)    Depression with anxiety       Dispostion: I considered admission for this patient, and patient disposition will be pending metabolization.  Please see provider signout for continuation of work-up     Final Clinical Impression(s) / ED Diagnoses Final diagnoses:  None     @PCDICTATION @    , MD 10/17/21 1718

## 2021-10-17 NOTE — ED Notes (Signed)
Trauma Response Nurse Documentation   Brianna Erickson is a 56 y.o. female arriving to [facility] ED via EMS  On Xarelto (rivaroxaban) daily. Trauma was activated as a Level 2 based on the following trauma criteria Elderly patients > 65 with head trauma on anti-coagulation (excluding ASA). Trauma team at the bedside on patient arrival. Story unclear, consulted with Dr Janee Morn about potential upgrade d/t hypotension and decision made to continue with medical work-up as event is most likely non-traumatic. Patient cleared for CT by Dr. Audrie Lia. Patient to CT with team. GCS 15.  History   Past Medical History:  Diagnosis Date   Alcohol use    CHF (congestive heart failure) (HCC)    Depression with anxiety      Past Surgical History:  Procedure Laterality Date   CARDIOVERSION N/A 09/08/2020   Procedure: CARDIOVERSION;  Surgeon: Jake Bathe, MD;  Location: [facility] ENDOSCOPY;  Service: Cardiovascular;  Laterality: N/A;   CHOLECYSTECTOMY     GASTRIC BYPASS         Initial Focused Assessment (If applicable, or please see trauma documentation): Patient A&Ox4, GCS 15 Poor circulation to BLE, edematous, faint extremity pulses Diaphoretic, bradycardic in 50s, hypotensive manual pressure 82/46 - Dr Janee Morn consulted for Level 1 activation and decision made to continue with medical workup Story unclear but appears patient may have fallen with some redness/swelling to left side of neck/shoulder  CT's Completed:   CT Head, CT Maxillofacial, CT C-Spine, and CT Chest w/ contrast with dissection and PE studies  Interventions:  IV RAC 18G, RFA 20G, trauma labs 1L NS bolus, levophed CXR/PXR CT Head/Maxillofacial/CTA neck/Chest with dissection and PE studies Consulted with sister per patient - found out that there could have been illicit drug use prior to event 2mg  Narcan with immediate response  Event Summary: 2mg  Narcan given, Levophed stopped Sister at bedside  Bedside handoff with ED RN  .    Brianna Erickson  Trauma Response RN  Please call TRN at [phone] for further assistance.

## 2021-10-17 NOTE — ED Notes (Signed)
RN reviewed discharge instructions with pt. Pt verbalized understanding and had no further questions. VSS upon discharge. Pt axo x4

## 2021-10-17 NOTE — Discharge Instructions (Addendum)
Your scans showed no fractures but you have significant bruising.  Your muscle enzymes are also elevated.  You need to stay hydrated.  You also have cocaine in your system.  Please talk to your doctor regarding early refill for your Percocet.  Please avoid taking too much of your medicines  You also have a urinary tract infection.  Please take Keflex 3 times daily for a week.  You will need to follow-up with your doctor and get a repeat chemistry test to check your kidney function and also muscle enzyme test.  Return to ER if you have another fall, overdose, lethargy, trouble breathing, chest pain

## 2021-10-17 NOTE — ED Notes (Signed)
Pt sister stated that pt had did lines of cocaine laced with heroin.

## 2021-10-17 NOTE — ED Triage Notes (Signed)
Pt last remembers ups coming at 8am at 115pm she called ems and doesn't remember anything from 8am till 115pm  she states she can't normally get up without help and doesn't know how she got into the chair. Pt has hematoma to the right ear and lateral side of the neck and back of the bead.  She is diaphoretic and pale

## 2021-10-17 NOTE — ED Provider Notes (Signed)
°  Physical Exam  BP 118/63    Pulse 62    Temp 98.5 F (36.9 C)    Resp 20    Ht 5\' 11"  (1.803 m)    Wt (!) 162.8 kg    SpO2 90%    BMI 50.06 kg/m   Physical Exam  Procedures  Procedures  ED Course / MDM    Medical Decision Making Care assumed at 4 PM.  Patient arrived after a fall.  Patient was noted to be altered and hypotensive.  There was concern for possible overdose so Narcan was given.  Patient was briefly on epinephrine drip but her blood pressure came up so epinephrine drip was discontinued.  At signout, patient was pending dissection study of the chest abdomen pelvis and CTA head and neck  5 pm Patient is more awake and alert.  Patient's dissection study is negative.  Patient has some contusion of the shoulder and neck area.  UDS is pending.  I also ordered CK level  9:27 PM Patient's UA is positive for UTI.  Patient given Rocephin in the ED.  Patient's UDS is positive for benzos and cocaine and opiates.  Patient is prescribed Percocet and Xanax.  Patient did admit to snorting cocaine.  Patient CK level is 2900.  Patient has mild AKI with Cr 1.5. Lactate down trending from 2.7 to 2.0.  I reassessed patient.  Patient is awake and alert and wants to go home.  I told her that she needs to avoid using cocaine.  Patient adamantly denies taking too much of her Percocet or Xanax.  She states that she ran out of Percocet.  Patient gets monthly prescription and states that she can get her prescription very soon.  We will give her a dose of Percocet in the ED and she will need to call her doctor if she needs early refill.  I will send patient home with Keflex for UTI.  Problems Addressed: Accidental drug overdose, initial encounter: acute illness or injury Acute cystitis without hematuria: acute illness or injury Contusion of neck, initial encounter: acute illness or injury Trauma: acute illness or injury  Amount and/or Complexity of Data Reviewed Labs: ordered. Decision-making details  documented in ED Course. Radiology: ordered and independent interpretation performed.  Risk Prescription drug management.          Drenda Freeze, MD 10/17/21 2129

## 2021-10-18 LAB — SAMPLE TO BLOOD BANK

## 2022-02-17 DEATH — deceased

## 2022-07-24 IMAGING — CR DG CHEST 2V
2 series · 2 of 2 positions shown · non-contrast
Comparison: Chest x-ray dated March 15, 2007.

CLINICAL DATA: Chronic shortness of breath and vomiting.

EXAM:
CHEST - 2 VIEW

[w chest pa *]
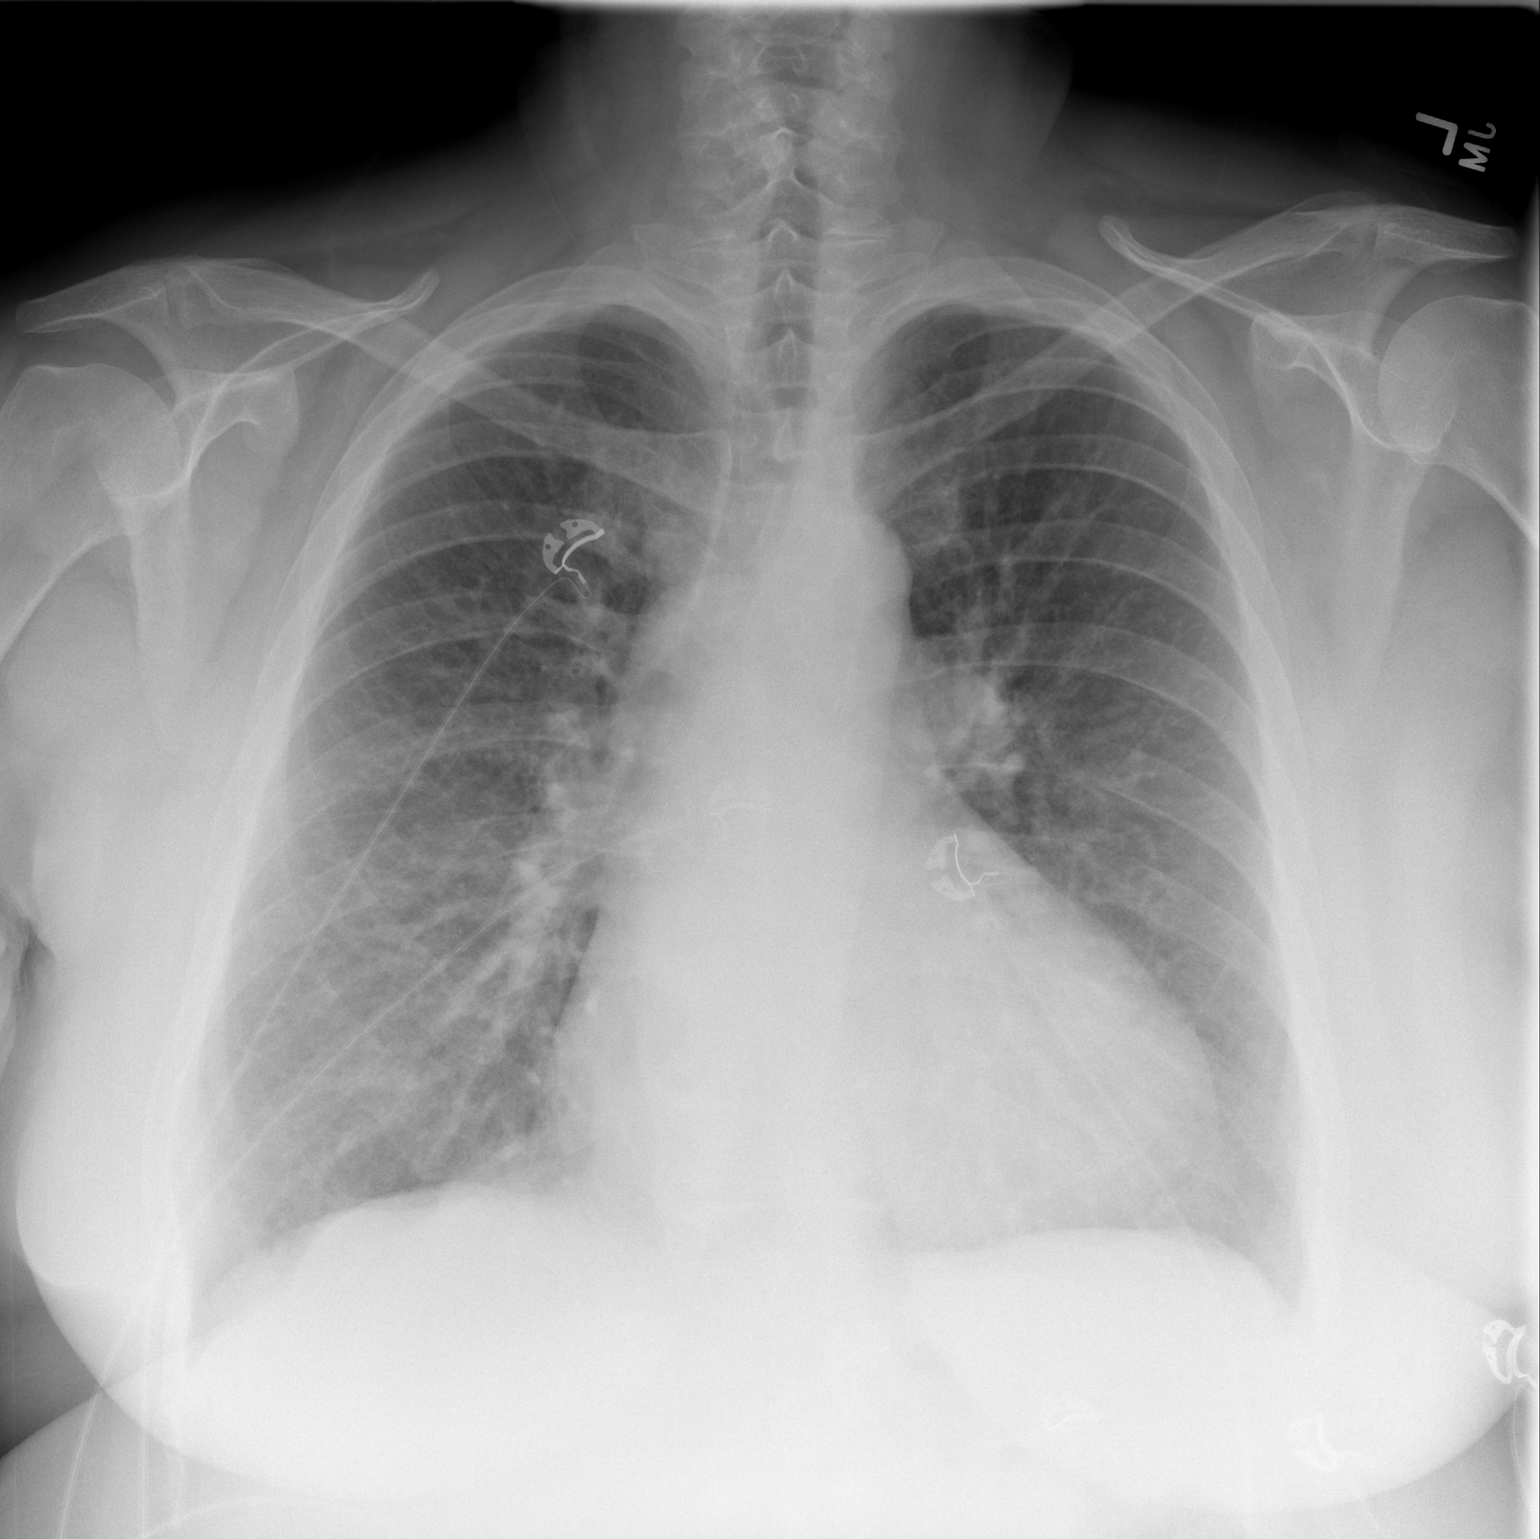

[w chest lat *]
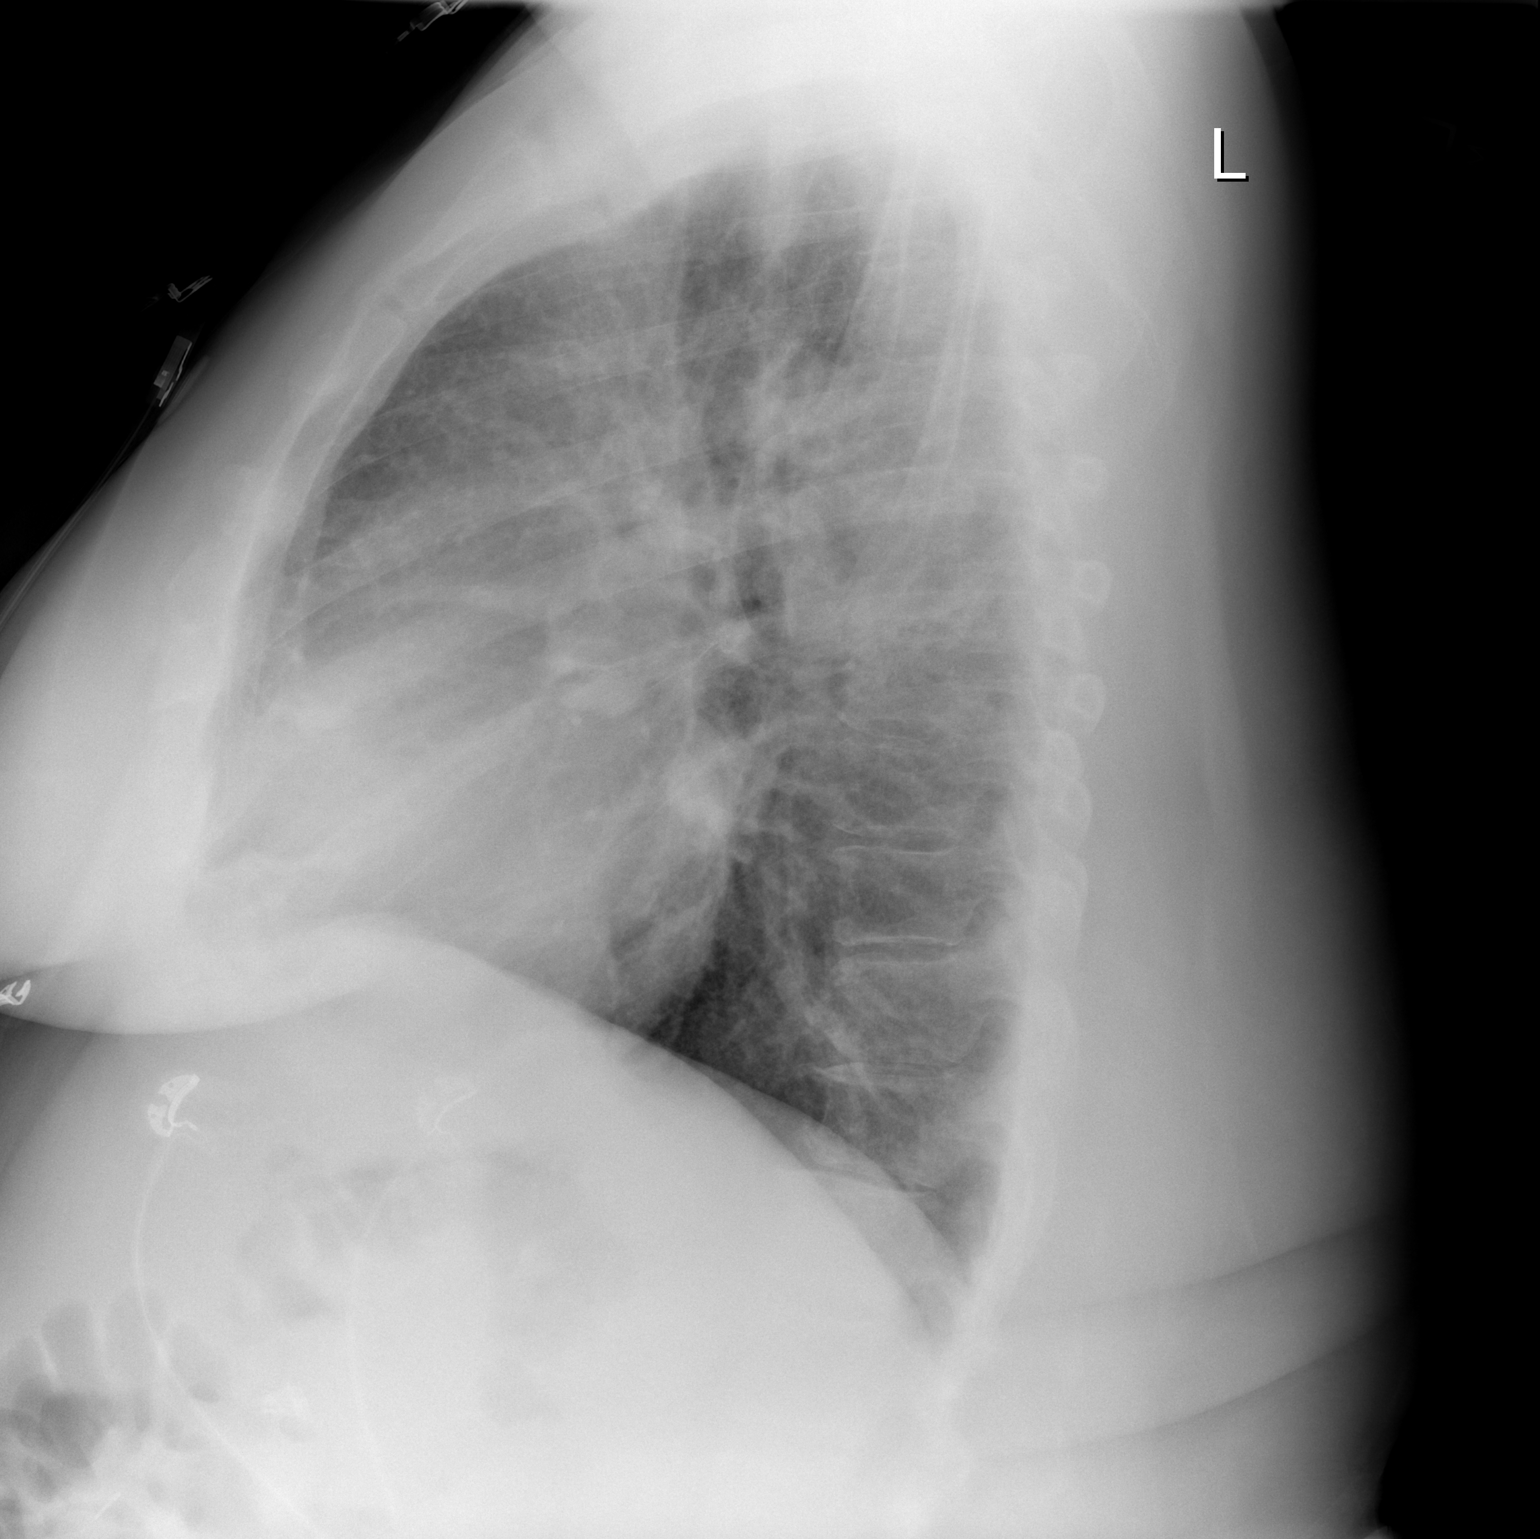

[2 of 2 positions shown; findings below may reference images not displayed]

FINDINGS: New mild cardiomegaly with pulmonary vascular congestion and mild
interstitial thickening in the mid to lower lungs. No focal
consolidation, pleural effusion, or pneumothorax. No acute osseous
abnormality.
IMPRESSION: 1. Mild congestive heart failure.

## 2022-09-27 ENCOUNTER — Encounter (HOSPITAL_COMMUNITY): Payer: Self-pay | Admitting: *Deleted

## 2023-10-21 IMAGING — CT CT CERVICAL SPINE W/O CM
5 series · 16 of 33 positions shown, 18 images · IV contrast (APPLIED)
Comparison: None.

CLINICAL DATA: Trauma, hematoma to right ear and lateral side of
the neck



[Series 1: cspine st · axial · 0.29mm/px · z∈[+370,+462]mm · 3 of 93 slices shown, 4 images]
[im 24/93  soft-tissue]
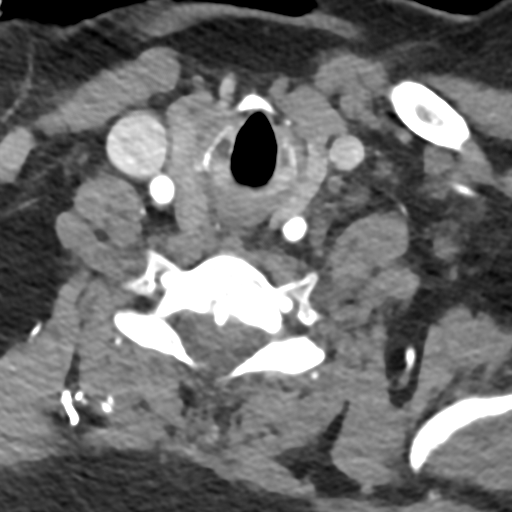
[im 24/93  bone]
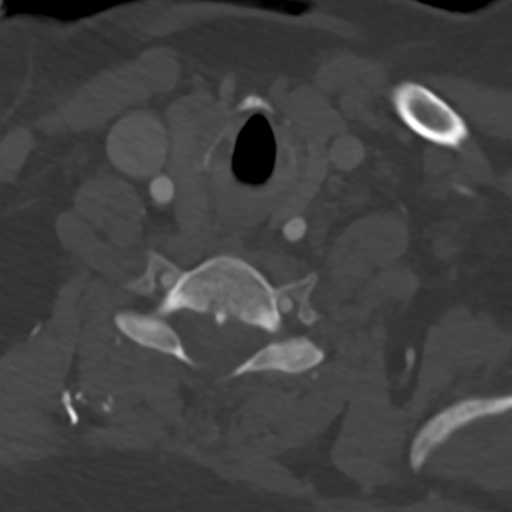
[im 47/93  bone]
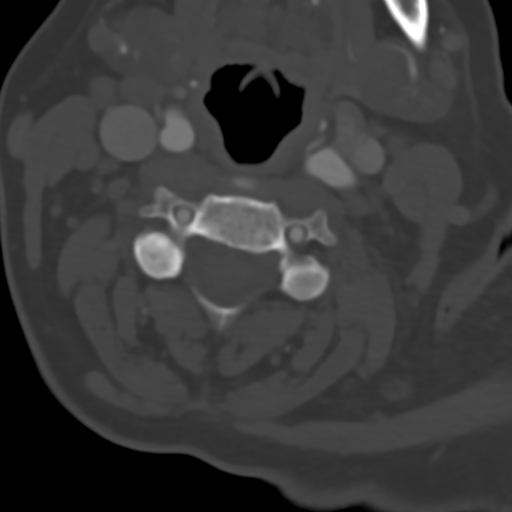
[im 70/93  bone]
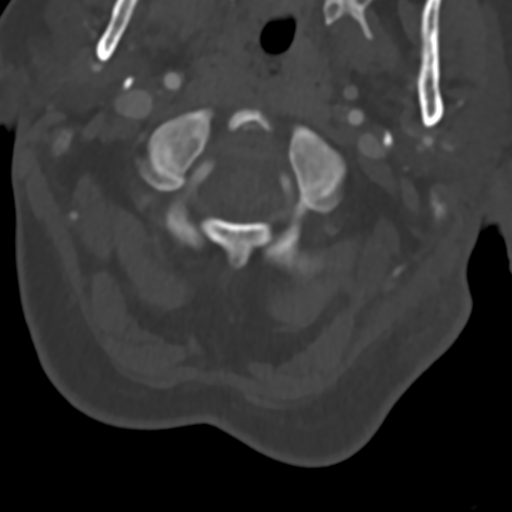

[Series 4: cspine bone · axial · 0.29mm/px · z∈[+370,+462]mm · 3 of 93 slices shown]
[im 24/93  bone]
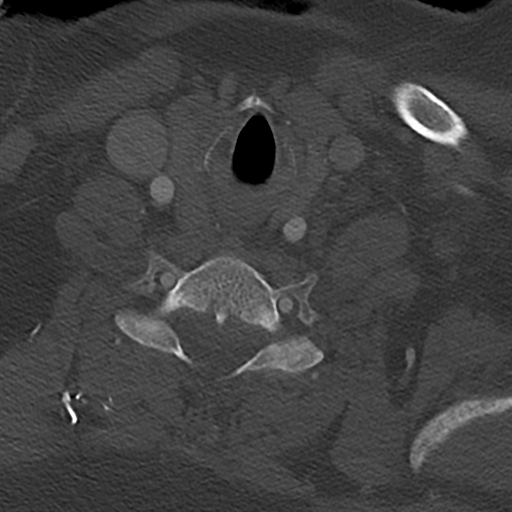
[im 47/93  bone]
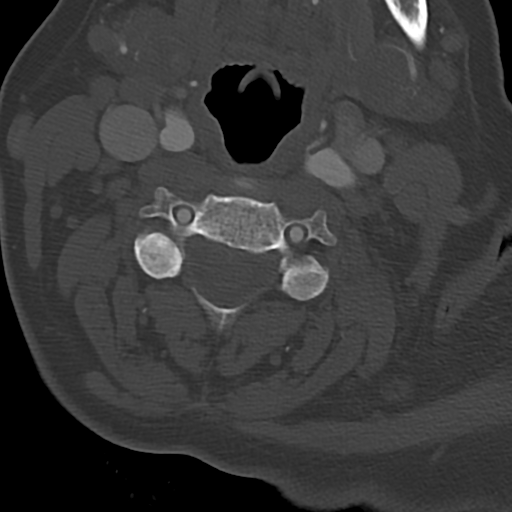
[im 70/93  bone]
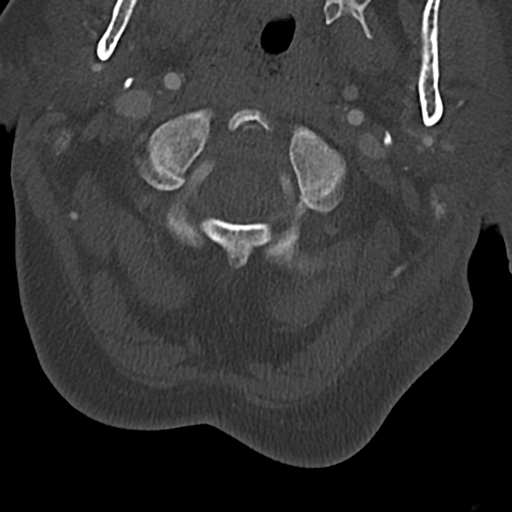

[Series 503: cor cspine · coronal · 0.36mm/px · 3 of 46 slices shown]
[im 10/46  bone]
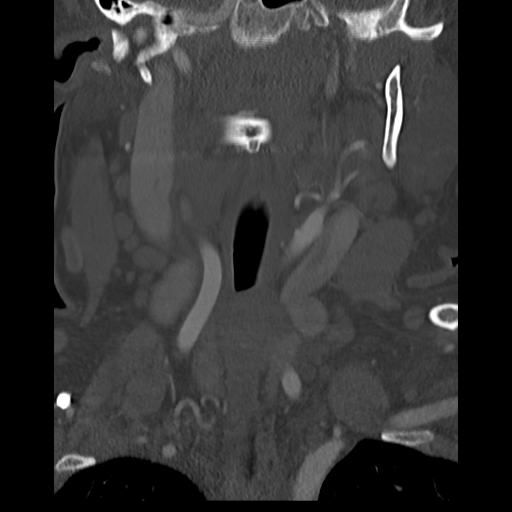
[im 19/46  bone]
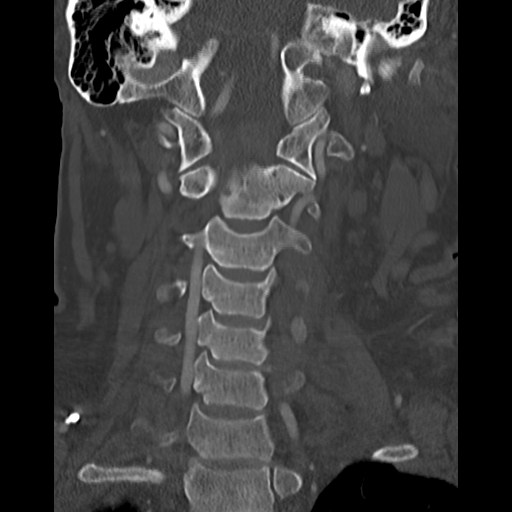
[im 28/46  bone]
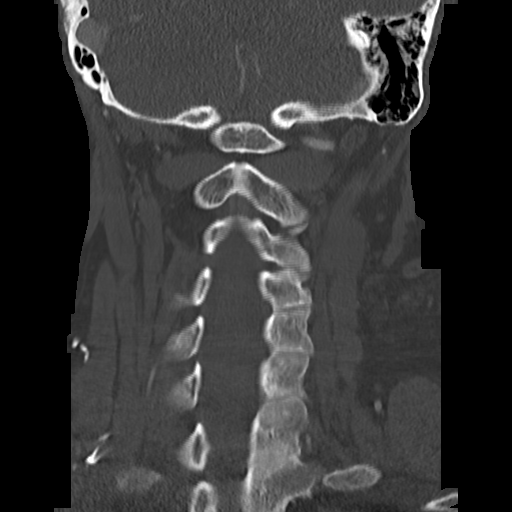

[Series 504: axial cspine · axial · 0.36mm/px · z∈[+336,+385]mm · 2 of 84 slices shown]
[im 28/84  bone]
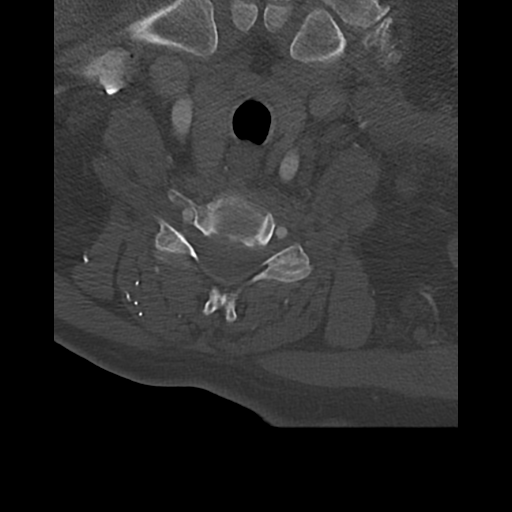
[im 56/84  bone]
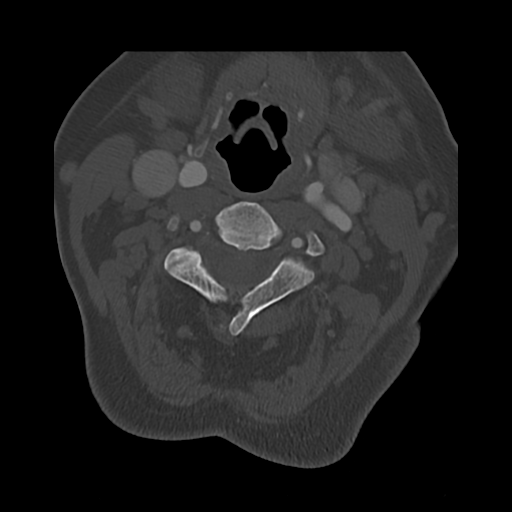

[Series 505: sag cspine · sagittal · 0.36mm/px · 5 of 73 slices shown, 6 images]
[im 25/73  bone]
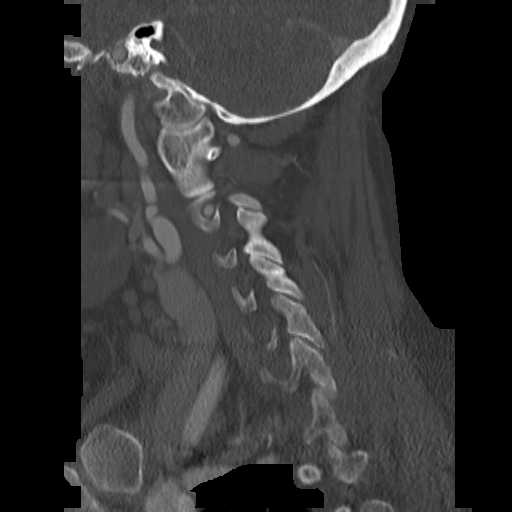
[im 31/73  bone]
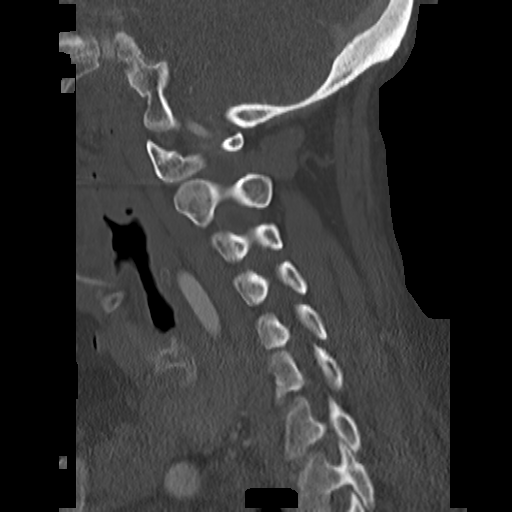
[im 37/73  soft-tissue]
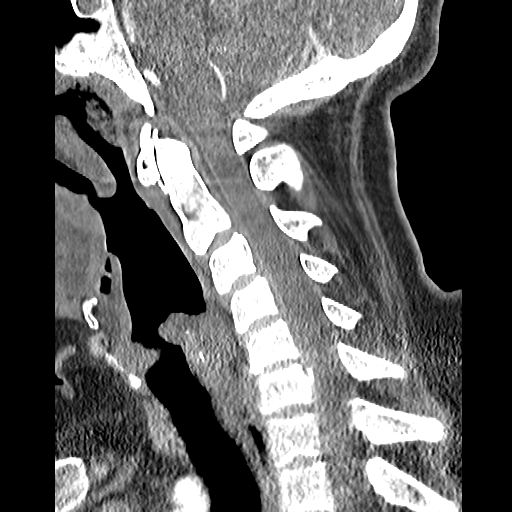
[im 37/73  bone]
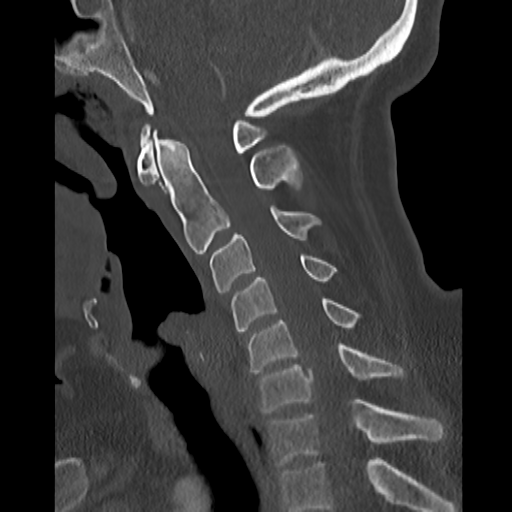
[im 43/73  bone]
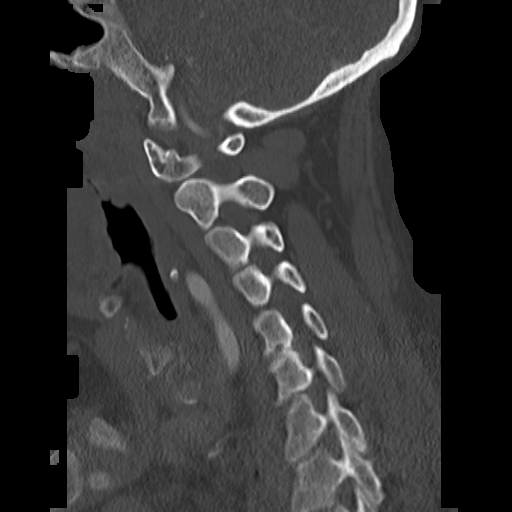
[im 49/73  bone]
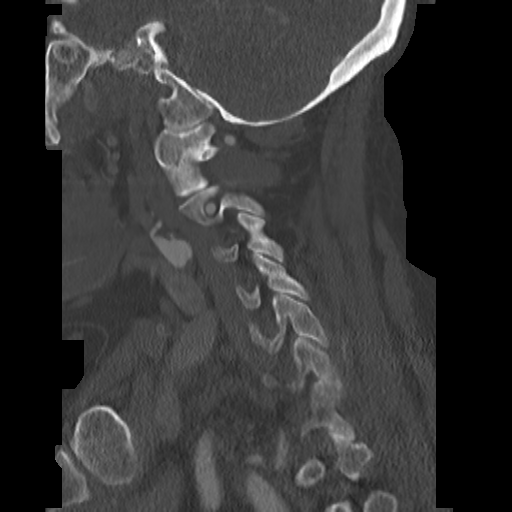

[16 of 33 positions shown; findings below may reference images not displayed]

FINDINGS: Alignment: There is slight reversal of the normal cervical spine
lordosis. There is no antero or retrolisthesis. There is no jumped
or perched facets or other evidence of traumatic malalignment.

Skull base and vertebrae: Skull base alignment is maintained.
Vertebral body heights are preserved. There is no evidence of acute
fracture.

Soft tissues and spinal canal: No prevertebral fluid or swelling. No
visible canal hematoma.

Disc levels: The disc spaces are overall preserved. There is minimal
degenerative endplate change at C5-C6. The spinal canal and neural
foramina are patent.

Upper chest: Lungs are assessed on the separately dictated CT chest.

Other: There is mild stranding in the fat of the left lower
neck/supraclavicular region, with scattered prominent left cervical
chain lymph nodes, described on the separately dictated CTA neck.
IMPRESSION: 1. No acute fracture or traumatic malalignment of the cervical
spine.
2. Please also refer to the separately dictated CTA neck.
